# Patient Record
Sex: Female | Born: 1990 | Race: Black or African American | Hispanic: No | Marital: Married | Smoking: Never smoker
Health system: Southern US, Community
[De-identification: ages and names within clinical notes are randomized; demographics above are authoritative.]

## PROBLEM LIST (undated history)

## (undated) ENCOUNTER — Emergency Department (HOSPITAL_COMMUNITY): Admission: EM | Payer: Medicaid Other

## (undated) DIAGNOSIS — J45909 Unspecified asthma, uncomplicated: Secondary | ICD-10-CM

## (undated) DIAGNOSIS — Z789 Other specified health status: Secondary | ICD-10-CM

## (undated) HISTORY — PX: NO PAST SURGERIES: SHX2092

## (undated) HISTORY — DX: Unspecified asthma, uncomplicated: J45.909

---
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
## 2018-10-02 ENCOUNTER — Ambulatory Visit: Payer: Self-pay

## 2018-10-08 ENCOUNTER — Other Ambulatory Visit: Payer: Self-pay | Admitting: Student

## 2018-10-08 ENCOUNTER — Emergency Department (HOSPITAL_COMMUNITY): Payer: Medicaid Other

## 2018-10-08 ENCOUNTER — Encounter (HOSPITAL_COMMUNITY): Payer: Self-pay

## 2018-10-08 ENCOUNTER — Other Ambulatory Visit: Payer: Self-pay

## 2018-10-08 ENCOUNTER — Emergency Department (HOSPITAL_COMMUNITY)
Admission: EM | Admit: 2018-10-08 | Discharge: 2018-10-08 | Disposition: A | Discharge status: Home / Self Care | Payer: Medicaid Other | Attending: Emergency Medicine | Admitting: Emergency Medicine

## 2018-10-08 DIAGNOSIS — O468X1 Other antepartum hemorrhage, first trimester: Secondary | ICD-10-CM | POA: Diagnosis not present

## 2018-10-08 DIAGNOSIS — R102 Pelvic and perineal pain: Secondary | ICD-10-CM | POA: Diagnosis not present

## 2018-10-08 DIAGNOSIS — O418X10 Other specified disorders of amniotic fluid and membranes, first trimester, not applicable or unspecified: Secondary | ICD-10-CM

## 2018-10-08 DIAGNOSIS — O26891 Other specified pregnancy related conditions, first trimester: Secondary | ICD-10-CM

## 2018-10-08 DIAGNOSIS — O9989 Other specified diseases and conditions complicating pregnancy, childbirth and the puerperium: Secondary | ICD-10-CM | POA: Diagnosis present

## 2018-10-08 DIAGNOSIS — Z3A01 Less than 8 weeks gestation of pregnancy: Secondary | ICD-10-CM | POA: Diagnosis not present

## 2018-10-08 LAB — I-STAT BETA HCG BLOOD, ED (MC, WL, AP ONLY): I-stat hCG, quantitative: >2000 m[IU]/mL — ABNORMAL HIGH (ref <5)

## 2018-10-08 LAB — URINALYSIS, ROUTINE W REFLEX MICROSCOPIC
Bacteria, UA: NONE SEEN
Bilirubin Urine: NEGATIVE
Glucose, UA: NEGATIVE mg/dL
Hgb urine dipstick: NEGATIVE
Ketones, ur: NEGATIVE mg/dL
Nitrite: NEGATIVE
Protein, ur: NEGATIVE mg/dL
Specific Gravity, Urine: 1.026 (ref 1.005–1.030)
pH: 5 (ref 5.0–8.0)

## 2018-10-08 LAB — COMPREHENSIVE METABOLIC PANEL
ALT: 18 U/L (ref 0–44)
AST: 15 U/L (ref 15–41)
Albumin: 3.4 g/dL — ABNORMAL LOW (ref 3.5–5.0)
Alkaline Phosphatase: 57 U/L (ref 38–126)
Anion gap: 6 (ref 5–15)
BUN: 8 mg/dL (ref 6–20)
CO2: 22 mmol/L (ref 22–32)
Calcium: 8.9 mg/dL (ref 8.9–10.3)
Chloride: 107 mmol/L (ref 98–111)
Creatinine, Ser: 0.72 mg/dL (ref 0.44–1.00)
GFR calc Af Amer: >60 mL/min (ref >60)
GFR calc non Af Amer: >60 mL/min (ref >60)
Glucose, Bld: 88 mg/dL (ref 70–99)
Potassium: 4.1 mmol/L (ref 3.5–5.1)
Sodium: 135 mmol/L (ref 135–145)
Total Bilirubin: 0.6 mg/dL (ref 0.3–1.2)
Total Protein: 7.2 g/dL (ref 6.5–8.1)

## 2018-10-08 LAB — CBC
HCT: 41.2 % (ref 36.0–46.0)
Hemoglobin: 12.5 g/dL (ref 12.0–15.0)
MCH: 25.6 pg — ABNORMAL LOW (ref 26.0–34.0)
MCHC: 30.3 g/dL (ref 30.0–36.0)
MCV: 84.4 fL (ref 80.0–100.0)
Platelets: 268 10*3/uL (ref 150–400)
RBC: 4.88 MIL/uL (ref 3.87–5.11)
RDW: 14.3 % (ref 11.5–15.5)
WBC: 7.9 10*3/uL (ref 4.0–10.5)
nRBC: 0 % (ref 0.0–0.2)

## 2018-10-08 LAB — LIPASE, BLOOD: Lipase: 17 U/L (ref 11–51)

## 2018-10-08 LAB — WET PREP, GENITAL
Clue Cells Wet Prep HPF POC: NONE SEEN
Sperm: NONE SEEN
Trich, Wet Prep: NONE SEEN
Yeast Wet Prep HPF POC: NONE SEEN

## 2018-10-08 LAB — HCG, QUANTITATIVE, PREGNANCY: hCG, Beta Chain, Quant, S: 20494 m[IU]/mL — ABNORMAL HIGH (ref <5)

## 2018-10-08 MED ORDER — SODIUM CHLORIDE 0.9% FLUSH: 3.0000 mL | Freq: Once | INTRAVENOUS | Status: DC

## 2018-10-08 MED ORDER — ACETAMINOPHEN 325 MG PO TABS
650.0000 mg | ORAL_TABLET | Freq: Once | ORAL | Status: AC
  Administered 2018-10-08: 650 mg via ORAL
  Filled 2018-10-08: qty 2

## 2018-10-08 NOTE — ED Notes (Signed)
US at bedside

## 2018-10-08 NOTE — Discharge Instructions (Addendum)
You were seen in the emergency department today for pelvic/back pain.  Your blood work is overall reassuring.  Your ultrasound shows that you have findings consistent with pregnancy, however it may be too early to entirely tell, you will need to have a repeat ultrasound as well as a repeat blood draw for further assessment of the pregnancy.  Please call OB/GYN to set this up, we have given you our women's outpatient information in your discharge instructions.  Your ultrasound also showed a subchorionic hemorrhage, this can happen in pregnancy and may be contributing to your pain.  Please try taking Tylenol as needed for discomfort. Please start taking a prenatal vitamin.  Please follow-up with OB/GYN, return to the ER for new or worsening symptoms including but limited to worsening pain, vaginal bleeding, dizziness, lightheadedness, passing out, or any other concerns.  Results for orders placed or performed during the hospital encounter of 10/08/18  Wet prep, genital   Specimen: Vaginal  Result Value Ref Range   Yeast Wet Prep HPF POC NONE SEEN NONE SEEN   Trich, Wet Prep NONE SEEN NONE SEEN   Clue Cells Wet Prep HPF POC NONE SEEN NONE SEEN   WBC, Wet Prep HPF POC FEW (A) NONE SEEN   Sperm NONE SEEN   Lipase, blood  Result Value Ref Range   Lipase 17 11 - 51 U/L  Comprehensive metabolic panel  Result Value Ref Range   Sodium 135 135 - 145 mmol/L   Potassium 4.1 3.5 - 5.1 mmol/L   Chloride 107 98 - 111 mmol/L   CO2 22 22 - 32 mmol/L   Glucose, Bld 88 70 - 99 mg/dL   BUN 8 6 - 20 mg/dL   Creatinine, Ser 1.610.72 0.44 - 1.00 mg/dL   Calcium 8.9 8.9 - 09.610.3 mg/dL   Total Protein 7.2 6.5 - 8.1 g/dL   Albumin 3.4 (L) 3.5 - 5.0 g/dL   AST 15 15 - 41 U/L   ALT 18 0 - 44 U/L   Alkaline Phosphatase 57 38 - 126 U/L   Total Bilirubin 0.6 0.3 - 1.2 mg/dL   GFR calc non Af Amer >60 >60 mL/min   GFR calc Af Amer >60 >60 mL/min   Anion gap 6 5 - 15  CBC  Result Value Ref Range   WBC 7.9 4.0 - 10.5  K/uL   RBC 4.88 3.87 - 5.11 MIL/uL   Hemoglobin 12.5 12.0 - 15.0 g/dL   HCT 04.541.2 40.936.0 - 81.146.0 %   MCV 84.4 80.0 - 100.0 fL   MCH 25.6 (L) 26.0 - 34.0 pg   MCHC 30.3 30.0 - 36.0 g/dL   RDW 91.414.3 78.211.5 - 95.615.5 %   Platelets 268 150 - 400 K/uL   nRBC 0.0 0.0 - 0.2 %  Urinalysis, Routine w reflex microscopic  Result Value Ref Range   Color, Urine YELLOW YELLOW   APPearance HAZY (A) CLEAR   Specific Gravity, Urine 1.026 1.005 - 1.030   pH 5.0 5.0 - 8.0   Glucose, UA NEGATIVE NEGATIVE mg/dL   Hgb urine dipstick NEGATIVE NEGATIVE   Bilirubin Urine NEGATIVE NEGATIVE   Ketones, ur NEGATIVE NEGATIVE mg/dL   Protein, ur NEGATIVE NEGATIVE mg/dL   Nitrite NEGATIVE NEGATIVE   Leukocytes,Ua TRACE (A) NEGATIVE   RBC / HPF 0-5 0 - 5 RBC/hpf   WBC, UA 0-5 0 - 5 WBC/hpf   Bacteria, UA NONE SEEN NONE SEEN   Squamous Epithelial / LPF 0-5 0 - 5  Mucus PRESENT   hCG, quantitative, pregnancy  Result Value Ref Range   hCG, Beta Chain, Quant, S 20,494 (H) <5 mIU/mL  I-Stat beta hCG blood, ED  Result Value Ref Range   I-stat hCG, quantitative >2,000.0 (H) <5 mIU/mL   Comment 3           US Ob Comp < 14 Wks  Result Date: 10/08/2018 CLINICAL DATA:  Pelvic pain EXAM: OBSTETRIC <14 WK [place] AND TRANSVAGINAL OB US TECHNIQUE: Both transabdominal and transvaginal ultrasound examinations were performed for complete evaluation of the gestation as well as the maternal uterus, adnexal regions, and pelvic cul-de-sac. Transvaginal technique was performed to assess early pregnancy. COMPARISON:  None. FINDINGS: Intrauterine gestational sac: Visualized Yolk sac:  Not visualized Embryo:  Not visualized Cardiac Activity: Not visualized MSD: 15 mm   6 w   2 d Subchorionic hemorrhage: There is a focus of subchorionic hemorrhage measuring 1.0 x 0.5 cm. Uterus/adnexae: No intrauterine mass. Cervical os closed. No extrauterine pelvic or adnexal mass. No free pelvic fluid. IMPRESSION: Probable early intrauterine gestational sac, but  no yolk sac, fetal pole, or cardiac activity yet visualized. Recommend follow-up quantitative B-HCG levels and follow-up US in 14 days to assess viability. This recommendation follows SRU consensus guidelines: Diagnostic Criteria for Nonviable Pregnancy Early in the First Trimester. [place] Med 2013; 517:6160-73. Based on gestational sac size, estimated gestational age is 6+ weeks. There is a subchorionic hemorrhage measuring 1.0 x 0.5 cm. No extrauterine pelvic mass or fluid. Electronically Signed   By: Lowella Grip III M.D.   On: 10/08/2018 13:05   US Ob Transvaginal  Result Date: 10/08/2018 CLINICAL DATA:  Pelvic pain EXAM: OBSTETRIC <14 WK [place] AND TRANSVAGINAL OB US TECHNIQUE: Both transabdominal and transvaginal ultrasound examinations were performed for complete evaluation of the gestation as well as the maternal uterus, adnexal regions, and pelvic cul-de-sac. Transvaginal technique was performed to assess early pregnancy. COMPARISON:  None. FINDINGS: Intrauterine gestational sac: Visualized Yolk sac:  Not visualized Embryo:  Not visualized Cardiac Activity: Not visualized MSD: 15 mm   6 w   2 d Subchorionic hemorrhage: There is a focus of subchorionic hemorrhage measuring 1.0 x 0.5 cm. Uterus/adnexae: No intrauterine mass. Cervical os closed. No extrauterine pelvic or adnexal mass. No free pelvic fluid. IMPRESSION: Probable early intrauterine gestational sac, but no yolk sac, fetal pole, or cardiac activity yet visualized. Recommend follow-up quantitative B-HCG levels and follow-up US in 14 days to assess viability. This recommendation follows SRU consensus guidelines: Diagnostic Criteria for Nonviable Pregnancy Early in the First Trimester. [employer] Med 2013; 710:6269-48. Based on gestational sac size, estimated gestational age is 6+ weeks. There is a subchorionic hemorrhage measuring 1.0 x 0.5 cm. No extrauterine pelvic mass or fluid. Electronically Signed   By: Lowella Grip III M.D.   On:  10/08/2018 13:05

## 2018-10-08 NOTE — ED Triage Notes (Signed)
Pt states abd and back pain x 1 week. Pt states that she recently had a positive test. Pt states that she normally has irregular periods, so is unsure of how far along she is

## 2018-10-08 NOTE — ED Provider Notes (Signed)
[facility] DEPT Provider Note   CSN: [id number] Arrival date & time: 10/08/18  0913     History   Chief Complaint Chief Complaint  Patient presents with   Possible Pregnancy   Abdominal Pain    HPI Brianna Stein is a 28 y.o. female that is G8P6A1 with what she suspect is a current new pregnancy who presents to the ED w/ complaints of abdominal pain intermittently x 1 week. Patient states pain is to the bilateral lower abdomen/back, crampy in nature, occurs a few times per day and lasts 1 hour with each episode. No alleviating/aggravating factors. Associated nausea. Had + preg test at home, unknown LMP, irregular periods s/p depo which she stopped 2 years ago. No prior pregnancy complications other than 1 miscarriage, all prior vaginal deliveries. Denies fever, chills, emesis, diarrhea, melena, hematochezia, vaginal bleeding/discharge, or dysuria. Sexually active w/ 1 female partner- husband, no concern for STDs.      HPI  History reviewed. No pertinent past medical history.  There are no active problems to display for this patient.   History reviewed. No pertinent surgical history.   OB History    Gravida  1   Para      Term      Preterm      AB      Living        SAB      TAB      Ectopic      Multiple      Live Births               Home Medications    Prior to Admission medications   Not on File    Family History No family history on file.  Social History Social History   Tobacco Use   Smoking status: Never Smoker   Smokeless tobacco: Never Used  Substance Use Topics   Alcohol use: Never    Frequency: Never   Drug use: Never     Allergies   Patient has no known allergies.   Review of Systems Review of Systems  Constitutional: Negative for chills and fever.  Respiratory: Negative for shortness of breath.   Cardiovascular: Negative for chest pain.  Gastrointestinal: Positive for abdominal  pain and nausea. Negative for anal bleeding, blood in stool, constipation, diarrhea and vomiting.  Genitourinary: Negative for dysuria, vaginal bleeding and vaginal discharge.  Musculoskeletal: Positive for back pain.  All other systems reviewed and are negative.    Physical Exam Updated Vital Signs BP (!) 116/53 (BP Location: Right Arm)    Pulse 77    Temp 98.3 F (36.8 C) (Oral)    Resp 16    Ht 5\' 1"  (1.549 m)    Wt 117.9 kg    SpO2 100%    BMI 49.13 kg/m   Physical Exam Vitals signs and nursing note reviewed.  Constitutional:      General: She is not in acute distress.    Appearance: She is well-developed. She is not toxic-appearing.  HENT:     Head: Normocephalic and atraumatic.  Eyes:     General:        Right eye: No discharge.        Left eye: No discharge.     Conjunctiva/sclera: Conjunctivae normal.  Neck:     Musculoskeletal: Neck supple.  Cardiovascular:     Rate and Rhythm: Normal rate and regular rhythm.  Pulmonary:     Effort: Pulmonary effort is normal.  No respiratory distress.     Breath sounds: Normal breath sounds. No wheezing, rhonchi or rales.  Abdominal:     General: There is no distension.     Palpations: Abdomen is soft.     Tenderness: There is abdominal tenderness in the suprapubic area. There is no guarding or rebound. Negative signs include Murphy's sign and McBurney's sign.  Genitourinary:    Labia:        Right: No rash, tenderness or lesion.        Left: No rash, tenderness or lesion.      Adnexa:        Right: No mass or fullness.         Left: No mass or fullness.       Comments: EDT present as chaperone. Diffuse discomfort throughout bimanual.  Minimal vaginal discharge present- thin & white No bleeding.    Skin:    General: Skin is warm and dry.     Findings: No rash.  Neurological:     Mental Status: She is alert.     Comments: Clear speech.   Psychiatric:        Behavior: Behavior normal.      ED Treatments / Results    Labs (all labs ordered are listed, but only abnormal results are displayed) Labs Reviewed  WET PREP, GENITAL - Abnormal; Notable for the following components:      Result Value   WBC, Wet Prep HPF POC FEW (*)    All other components within normal limits  COMPREHENSIVE METABOLIC PANEL - Abnormal; Notable for the following components:   Albumin 3.4 (*)    All other components within normal limits  CBC - Abnormal; Notable for the following components:   MCH 25.6 (*)    All other components within normal limits  URINALYSIS, ROUTINE W REFLEX MICROSCOPIC - Abnormal; Notable for the following components:   APPearance HAZY (*)    Leukocytes,Ua TRACE (*)    All other components within normal limits  HCG, QUANTITATIVE, PREGNANCY - Abnormal; Notable for the following components:   hCG, Beta Chain, Quant, S 20,494 (*)    All other components within normal limits  I-STAT BETA HCG BLOOD, ED ([facility], [facility], [facility] ONLY) - Abnormal; Notable for the following components:   I-stat hCG, quantitative >2,000.0 (*)    All other components within normal limits  URINE CULTURE  LIPASE, BLOOD  RPR  HIV ANTIBODY (ROUTINE TESTING W REFLEX)  GC/CHLAMYDIA PROBE AMP ([place]) NOT AT [facility]    EKG None  Radiology US Ob Comp < 14 Wks  Result Date: 10/08/2018 CLINICAL DATA:  Pelvic pain EXAM: OBSTETRIC <14 WK [place] AND TRANSVAGINAL OB US TECHNIQUE: Both transabdominal and transvaginal ultrasound examinations were performed for complete evaluation of the gestation as well as the maternal uterus, adnexal regions, and pelvic cul-de-sac. Transvaginal technique was performed to assess early pregnancy. COMPARISON:  None. FINDINGS: Intrauterine gestational sac: Visualized Yolk sac:  Not visualized Embryo:  Not visualized Cardiac Activity: Not visualized MSD: 15 mm   6 w   2 d Subchorionic hemorrhage: There is a focus of subchorionic hemorrhage measuring 1.0 x 0.5 cm. Uterus/adnexae: No intrauterine mass. Cervical os closed. No  extrauterine pelvic or adnexal mass. No free pelvic fluid. IMPRESSION: Probable early intrauterine gestational sac, but no yolk sac, fetal pole, or cardiac activity yet visualized. Recommend follow-up quantitative B-HCG levels and follow-up US in 14 days to assess viability. This recommendation follows SRU consensus guidelines: Diagnostic Criteria for  Nonviable Pregnancy Early in the First Trimester. Malva Limes Engl J Med 2013; 161:0960-45; 369:1443-51. Based on gestational sac size, estimated gestational age is 6+ weeks. There is a subchorionic hemorrhage measuring 1.0 x 0.5 cm. No extrauterine pelvic mass or fluid. Electronically Signed   By: Bretta BangWilliam  Woodruff III M.D.   On: 10/08/2018 13:05   [place] Ob Transvaginal  Result Date: 10/08/2018 CLINICAL DATA:  Pelvic pain EXAM: OBSTETRIC <14 WK US AND TRANSVAGINAL OB US TECHNIQUE: Both transabdominal and transvaginal ultrasound examinations were performed for complete evaluation of the gestation as well as the maternal uterus, adnexal regions, and pelvic cul-de-sac. Transvaginal technique was performed to assess early pregnancy. COMPARISON:  None. FINDINGS: Intrauterine gestational sac: Visualized Yolk sac:  Not visualized Embryo:  Not visualized Cardiac Activity: Not visualized MSD: 15 mm   6 w   2 d Subchorionic hemorrhage: There is a focus of subchorionic hemorrhage measuring 1.0 x 0.5 cm. Uterus/adnexae: No intrauterine mass. Cervical os closed. No extrauterine pelvic or adnexal mass. No free pelvic fluid. IMPRESSION: Probable early intrauterine gestational sac, but no yolk sac, fetal pole, or cardiac activity yet visualized. Recommend follow-up quantitative B-HCG levels and follow-up US in 14 days to assess viability. This recommendation follows SRU consensus guidelines: Diagnostic Criteria for Nonviable Pregnancy Early in the First Trimester. Malva Limes Engl J Med 2013; 409:8119-14; 369:1443-51. Based on gestational sac size, estimated gestational age is 6+ weeks. There is a subchorionic hemorrhage  measuring 1.0 x 0.5 cm. No extrauterine pelvic mass or fluid. Electronically Signed   By: Bretta BangWilliam  Woodruff III M.D.   On: 10/08/2018 13:05    Procedures Procedures (including critical care time)  Medications Ordered in ED Medications  sodium chloride flush (NS) 0.9 % injection 3 mL (has no administration in time range)  acetaminophen (TYLENOL) tablet 650 mg (650 mg Oral Given 10/08/18 1350)     Initial Impression / Assessment and Plan / ED Course  I have reviewed the triage vital signs and the nursing notes.  Pertinent labs & imaging results that were available during my care of the patient were reviewed by me and considered in my medical decision making (see chart for details).   Patient who is G8, P6 A1 with current pregnancy presents to the emergency department for evaluation of intermittent abdominal/back pain.  She is nontoxic-appearing, resting comfortably, vitals without significant abnormality.  She has not had ultrasound to confirm pregnancy or seen an OB/GYN yet.  Exam with mild suprapubic tenderness as well as diffuse discomfort throughout bimanual exam.  No active vaginal bleeding.  Pregnancy test is positive.  We will proceed with labs as well as ultrasound, rule out ectopic.  CBC: No leukocytosis or anemia CMP: Albumin minimally low, electrolytes within normal limits, renal function and liver function preserved. Lipase: WNL Urinalysis: Trace leukocytes, no bacteria, will culture given she is pregnant. Quant: 78,295: 20,494  US: Probable early intrauterine gestational sac, but no yolk sac, fetal pole, or cardiac activity yet visualized. Recommend follow-up quantitative B-HCG levels and follow-up US in 14 days to assess viability.  Based on gestational sac size, estimated gestational age is 6+ weeks. There is a subchorionic hemorrhage measuring 1.0 x 0.5 cm. No extrauterine pelvic mass or fluid.   Will discharge home with instructions to start prenatal vitamins, tylenol for pain, &  close OB follow up for repeat quant/US- she has appointment scheduled for this upcoming monday. I discussed results, treatment plan, need for follow-up, and return precautions with the patient. Provided opportunity for questions, patient confirmed understanding and  is in agreement with plan.   Findings and plan of care discussed with supervising physician Dr. Jacqulyn BathLong who is in agreement.    Final Clinical Impressions(s) / ED Diagnoses   Final diagnoses:  Pelvic pain affecting pregnancy in first trimester, antepartum  Subchorionic hemorrhage of placenta in first trimester, single or unspecified fetus    ED Discharge Orders    None       Cherly Andersonetrucelli, Aleenah Homen R, PA-C 10/08/18 1351    Long, Arlyss RepressJoshua G, MD 10/09/18 [phone]

## 2018-10-08 NOTE — ED Notes (Signed)
ED Provider at bedside. 

## 2018-10-09 LAB — CERVICOVAGINAL ANCILLARY ONLY
Chlamydia: NEGATIVE
Neisseria Gonorrhea: NEGATIVE

## 2018-10-09 LAB — HIV ANTIBODY (ROUTINE TESTING W REFLEX): HIV Screen 4th Generation wRfx: NONREACTIVE

## 2018-10-09 LAB — RPR: RPR Ser Ql: NONREACTIVE

## 2018-10-09 LAB — URINE CULTURE

## 2018-10-12 ENCOUNTER — Ambulatory Visit (INDEPENDENT_AMBULATORY_CARE_PROVIDER_SITE_OTHER): Payer: Medicaid Other | Admitting: *Deleted

## 2018-10-12 ENCOUNTER — Other Ambulatory Visit: Payer: Self-pay

## 2018-10-12 VITALS — BP 138/82 | HR 98 | Temp 97.9°F | Ht 61.0 in | Wt 309.0 lb

## 2018-10-12 DIAGNOSIS — Z3480 Encounter for supervision of other normal pregnancy, unspecified trimester: Secondary | ICD-10-CM | POA: Insufficient documentation

## 2018-10-12 DIAGNOSIS — Z3201 Encounter for pregnancy test, result positive: Secondary | ICD-10-CM | POA: Diagnosis not present

## 2018-10-12 DIAGNOSIS — Z3200 Encounter for pregnancy test, result unknown: Secondary | ICD-10-CM

## 2018-10-12 LAB — POCT URINE PREGNANCY: Preg Test, Ur: POSITIVE — AB

## 2018-10-12 MED ORDER — VITAFOL GUMMIES 3.33-0.333-34.8 MG PO CHEW: 3.0000 | CHEWABLE_TABLET | Freq: Every day | ORAL | 12 refills | Status: DC

## 2018-10-12 NOTE — Progress Notes (Signed)
    PRENATAL INTAKE SUMMARY  Ms. Maclellan presents today New OB Nurse Interview.  OB History    Gravida  7   Para  5   Term  4   Preterm  1   AB  1   Living  5     SAB  1   TAB      Ectopic      Multiple      Live Births  5          I have reviewed the patient's medical, obstetrical, social, and family histories, medications, and available lab results.  Ms. Zehring presents today for UPT. She has no unusual complaints. LMP: 08/05/2018 (questionable).  SUBJECTIVE She has no unusual complaints.  OBJECTIVE Initial nurse interview for history (New OB). Home UPT Result: Positive In-Office UPT result: Positive I have reviewed the patient's medical, obstetrical, social, and family histories, and medications.   GENERAL APPEARANCE: alert, well appearing, in no apparent distress, oriented to person, place and time, overweight  EDD: 06/01/2019 by ultrasound on 10/08/2018 GA: [redacted]w[redacted]d G7P4115  ASSESSMENT Positive pregnancy test Normal pregnancy  PLAN Prenatal vitamins (gummies) sent to pharmacy. All labs/physical will be completed at next visit with provider.  Derl Barrow, RN

## 2018-11-06 ENCOUNTER — Other Ambulatory Visit (HOSPITAL_COMMUNITY)
Admission: RE | Admit: 2018-11-06 | Discharge: 2018-11-06 | Disposition: A | Discharge status: Home / Self Care | Payer: Medicaid Other | Source: Ambulatory Visit | Attending: Advanced Practice Midwife | Admitting: Advanced Practice Midwife

## 2018-11-06 ENCOUNTER — Ambulatory Visit (INDEPENDENT_AMBULATORY_CARE_PROVIDER_SITE_OTHER): Payer: Medicaid Other | Admitting: Advanced Practice Midwife

## 2018-11-06 ENCOUNTER — Other Ambulatory Visit: Payer: Self-pay

## 2018-11-06 ENCOUNTER — Encounter: Payer: Self-pay | Admitting: Advanced Practice Midwife

## 2018-11-06 ENCOUNTER — Encounter: Payer: Self-pay | Admitting: General Practice

## 2018-11-06 VITALS — BP 115/68 | HR 87 | Temp 98.3°F | Wt 304.4 lb

## 2018-11-06 DIAGNOSIS — Z3480 Encounter for supervision of other normal pregnancy, unspecified trimester: Secondary | ICD-10-CM | POA: Diagnosis present

## 2018-11-06 DIAGNOSIS — Z124 Encounter for screening for malignant neoplasm of cervix: Secondary | ICD-10-CM | POA: Diagnosis present

## 2018-11-06 DIAGNOSIS — Z3481 Encounter for supervision of other normal pregnancy, first trimester: Secondary | ICD-10-CM | POA: Diagnosis not present

## 2018-11-06 DIAGNOSIS — Z3A10 10 weeks gestation of pregnancy: Secondary | ICD-10-CM

## 2018-11-06 MED ORDER — CYCLOBENZAPRINE HCL 10 MG PO TABS: 10.0000 mg | ORAL_TABLET | Freq: Three times a day (TID) | ORAL | 1 refills | Status: DC | PRN

## 2018-11-06 MED ORDER — VITAFOL GUMMIES 3.33-0.333-34.8 MG PO CHEW: 3.0000 | CHEWABLE_TABLET | Freq: Every day | ORAL | 12 refills | Status: DC

## 2018-11-06 MED ORDER — BLOOD PRESSURE MONITOR AUTOMAT DEVI: 1.0000 | Freq: Every day | 0 refills | Status: DC

## 2018-11-06 MED ORDER — ONDANSETRON 8 MG PO TBDP: 8.0000 mg | ORAL_TABLET | Freq: Three times a day (TID) | ORAL | 0 refills | Status: DC | PRN

## 2018-11-06 NOTE — Addendum Note (Signed)
Addended by: Derl Barrow on: 11/06/2018 09:36 AM   Modules accepted: Orders

## 2018-11-06 NOTE — Progress Notes (Signed)
Subjective:   Brianna Stein is a 28 y.o. [id number] at [redacted]w[redacted]d by LMP, early ultrasound being seen today for her first obstetrical visit.  Her obstetrical history is significant for obesity. Patient does intend to breast feed. Pregnancy history fully reviewed.  Patient reports backache and nausea.  HISTORY: OB History  Gravida Para Term Preterm AB Living  7 5 4 1 1 5   SAB TAB Ectopic Multiple Live Births  1 0 0 0 5    # Outcome Date GA Lbr Len/2nd Weight Sex Delivery Anes PTL Lv  7 Current           6 Term 12/01/16 [redacted]w[redacted]d  7 lb (3.175 kg) M Vag-Spont EPI N LIV  5 Preterm 12/18/15 [redacted]w[redacted]d  5 lb 6 oz (2.438 kg) F Vag-Spont EPI N LIV  4 Term 12/10/14 [redacted]w[redacted]d  7 lb (3.175 kg) M Vag-Spont  N LIV  3 Term 06/24/13 [redacted]w[redacted]d  6 lb 7 oz (2.92 kg) F Vag-Spont EPI N LIV  2 SAB 2015          1 Term 01/02/09 [redacted]w[redacted]d  6 lb 8 oz (2.948 kg) F Vag-Spont  Y LIV    Last pap smear was done unsure and was unsure  Past Medical History:  Diagnosis Date  . Medical history non-contributory    Past Surgical History:  Procedure Laterality Date  . NO PAST SURGERIES     No family history on file. Social History   Tobacco Use  . Smoking status: Never Smoker  . Smokeless tobacco: Never Used  Substance Use Topics  . Alcohol use: Never    Frequency: Never  . Drug use: Never   No Known Allergies Current Outpatient Medications on File Prior to Visit  Medication Sig Dispense Refill  . acetaminophen (TYLENOL) 325 MG tablet Take 650 mg by mouth every 6 (six) hours as needed for mild pain or headache.    . Prenatal Vit-Fe Phos-FA-Omega (VITAFOL GUMMIES) 3.33-0.333-34.8 MG CHEW Chew 3 each by mouth daily. 90 tablet 12   No current facility-administered medications on file prior to visit.     Review of Systems Pertinent items noted in HPI and remainder of comprehensive ROS otherwise negative.  Exam   Vitals:   11/06/18 0851  BP: 115/68  Pulse: 87  Temp: 98.3 F (36.8 C)  Weight: (!) 304 lb 6.4 oz (138.1 kg)       Physical Exam   Assessment:   Pregnancy: [id number] Patient Active Problem List   Diagnosis Date Noted  . Supervision of other normal pregnancy, antepartum 10/12/2018     Plan:  1. Supervision of other normal pregnancy, antepartum - Routine care - Obstetric Panel, Including HIV - Culture, OB Urine - Genetic Screening - Hemoglobin A1c - Cytology - PAP( [place]) - Cervicovaginal ancillary only( [place])  2. Pap smear for cervical cancer screening - Cytology - PAP( [place])   Initial labs drawn. Continue prenatal vitamins. Genetic Screening discussed, NIPS: requested. Ultrasound discussed; fetal anatomic survey: requested. Problem list reviewed and updated. The nature of [facility] with multiple MDs and other Advanced Practice Providers was explained to patient; also emphasized that residents, students are part of our team. Routine obstetric precautions reviewed. 50% of 45 min visit spent in counseling and coordination of care. FU in 4 weeks for virtual visit, will send in RX for BP cuff  No follow-ups on file.   Marcille Buffy DNP, CNM  11/06/18  8:53 AM

## 2018-11-07 LAB — OBSTETRIC PANEL, INCLUDING HIV
Antibody Screen: NEGATIVE
Basophils Absolute: 0 10*3/uL (ref 0.0–0.2)
Basos: 0 %
EOS (ABSOLUTE): 0.2 10*3/uL (ref 0.0–0.4)
Eos: 2 %
HIV Screen 4th Generation wRfx: NONREACTIVE
Hematocrit: 38 % (ref 34.0–46.6)
Hemoglobin: 12.2 g/dL (ref 11.1–15.9)
Hepatitis B Surface Ag: NEGATIVE
Immature Grans (Abs): 0 10*3/uL (ref 0.0–0.1)
Immature Granulocytes: 0 %
Lymphocytes Absolute: 2 10*3/uL (ref 0.7–3.1)
Lymphs: 23 %
MCH: 25.7 pg — ABNORMAL LOW (ref 26.6–33.0)
MCHC: 32.1 g/dL (ref 31.5–35.7)
MCV: 80 fL (ref 79–97)
Monocytes Absolute: 0.6 10*3/uL (ref 0.1–0.9)
Monocytes: 7 %
Neutrophils Absolute: 5.7 10*3/uL (ref 1.4–7.0)
Neutrophils: 68 %
Platelets: 274 10*3/uL (ref 150–450)
RBC: 4.74 x10E6/uL (ref 3.77–5.28)
RDW: 14.5 % (ref 11.7–15.4)
RPR Ser Ql: NONREACTIVE
Rh Factor: POSITIVE
Rubella Antibodies, IGG: 3.93 index (ref >0.99)
WBC: 8.4 10*3/uL (ref 3.4–10.8)

## 2018-11-07 LAB — HEMOGLOBIN A1C
Est. average glucose Bld gHb Est-mCnc: 111 mg/dL
Hgb A1c MFr Bld: 5.5 % (ref 4.8–5.6)

## 2018-11-08 LAB — URINE CULTURE, OB REFLEX

## 2018-11-08 LAB — CULTURE, OB URINE

## 2018-11-09 LAB — CYTOLOGY - PAP: Diagnosis: NEGATIVE

## 2018-11-11 LAB — CERVICOVAGINAL ANCILLARY ONLY
Bacterial Vaginitis (gardnerella): POSITIVE — AB
Candida Glabrata: NEGATIVE
Candida Vaginitis: POSITIVE — AB
Chlamydia: NEGATIVE
Comment: NEGATIVE
Comment: NEGATIVE
Comment: NEGATIVE
Comment: NEGATIVE
Comment: NEGATIVE
Comment: NORMAL
Neisseria Gonorrhea: NEGATIVE
Trichomonas: NEGATIVE

## 2018-11-11 MED ORDER — TERCONAZOLE 0.4 % VA CREA: 1.0000 | TOPICAL_CREAM | Freq: Every day | VAGINAL | 0 refills | Status: AC

## 2018-11-11 MED ORDER — METRONIDAZOLE 500 MG PO TABS: 500.0000 mg | ORAL_TABLET | Freq: Two times a day (BID) | ORAL | 0 refills | Status: DC

## 2018-11-11 NOTE — Addendum Note (Signed)
Addended by: Marcille Buffy D on: 11/11/2018 01:03 PM   Modules accepted: Orders

## 2018-11-17 ENCOUNTER — Encounter: Payer: Self-pay | Admitting: General Practice

## 2018-12-03 ENCOUNTER — Other Ambulatory Visit: Payer: Self-pay

## 2018-12-03 ENCOUNTER — Encounter: Payer: Self-pay | Admitting: General Practice

## 2018-12-03 ENCOUNTER — Ambulatory Visit (INDEPENDENT_AMBULATORY_CARE_PROVIDER_SITE_OTHER): Payer: Medicaid Other | Admitting: Obstetrics and Gynecology

## 2018-12-03 ENCOUNTER — Encounter: Payer: Self-pay | Admitting: Obstetrics and Gynecology

## 2018-12-03 VITALS — BP 116/80 | HR 116 | Temp 98.3°F | Wt 304.0 lb

## 2018-12-03 DIAGNOSIS — O99211 Obesity complicating pregnancy, first trimester: Secondary | ICD-10-CM

## 2018-12-03 DIAGNOSIS — Z3480 Encounter for supervision of other normal pregnancy, unspecified trimester: Secondary | ICD-10-CM

## 2018-12-03 DIAGNOSIS — Z3A14 14 weeks gestation of pregnancy: Secondary | ICD-10-CM

## 2018-12-03 NOTE — Progress Notes (Signed)
     LOW-RISK PREGNANCY OFFICE VISIT Patient name: Brianna Stein MRN [id number]  Date of birth: 1990/07/25 Chief Complaint:   Routine Prenatal Visit  History of Present Illness:   Brianna Stein is a 28 y.o. [id number] female at 104w2d with an Estimated Date of Delivery: 06/01/19 being seen today for ongoing management of a low-risk pregnancy.  Today she reports no complaints. Contractions: Not present. Vag. Bleeding: None.  Movement: Absent. denies leaking of fluid. Review of Systems:   Pertinent items are noted in HPI Denies abnormal vaginal discharge w/ itching/odor/irritation, headaches, visual changes, shortness of breath, chest pain, abdominal pain, severe nausea/vomiting, or problems with urination or bowel movements unless otherwise stated above. Pertinent History Reviewed:  Reviewed past medical,surgical, social, obstetrical and family history.  Reviewed problem list, medications and allergies. Physical Assessment:   Vitals:   12/03/18 0959  BP: 116/80  Pulse: (!) 116  Temp: 98.3 F (36.8 C)  Weight: (!) 304 lb (137.9 kg)  Body mass index is 57.44 kg/m.        Physical Examination:   General appearance: Well appearing, and in no distress  Mental status: Alert, oriented to person, place, and time  Skin: Warm & dry  Cardiovascular: Normal heart rate noted  Respiratory: Normal respiratory effort, no distress  Abdomen: Soft, gravid, nontender  Pelvic: Cervical exam deferred         Extremities: Edema: None  Patient informed that the ultrasound is considered a limited OB ultrasound and is not intended to be a complete ultrasound exam.  Patient also informed that the ultrasound is not being completed with the intent of assessing for fetal or placental anomalies or any pelvic abnormalities.  Explained that the purpose of today's ultrasound is to assess for viability.  Baby was found to be active, FHR visually in 140s. Patient acknowledges the purpose of the exam and the limitations of  the study.    Fetal Status: Fetal Heart Rate (bpm): 140   Movement: Absent      Assessment & Plan:  1) Low-risk pregnancy [id number] at [redacted]w[redacted]d with an Estimated Date of Delivery: 06/01/19   2) Obesity affecting pregnancy, antepartum, first trimester  - [place] MFM OB COMP + 84 WK  3) Supervision of other normal pregnancy, antepartum  - Genetic Screening,  - [place] MFM OB COMP + 33 WK - Reassurance given that fetal well-being is normal by today's visit, but we had to ensure that she had a viable pregnancy.    Meds: Rx for Phenergan 12.5 mg every 6 hours prn n/v  Labs/procedures today: panorama repeated per recommendation of [facility]  Plan:  Continue routine obstetrical care   Reviewed: Preterm labor symptoms and general obstetric precautions including but not limited to vaginal bleeding, contractions, leaking of fluid and fetal movement were reviewed in detail with the patient.  All questions were answered. Has home bp cuff. Check bp weekly, let us know if >140/90.   Follow-up: Return in about 6 weeks (around 01/14/2019) for Return OB - My Chart video.  Orders Placed This Encounter  Procedures  . [place] MFM OB COMP + 14 WK  . Genetic Screening   Laury Deep MSN, CNM 12/03/2018 1:06 PM

## 2018-12-05 ENCOUNTER — Encounter: Payer: Self-pay | Admitting: Obstetrics and Gynecology

## 2018-12-14 ENCOUNTER — Encounter: Payer: Self-pay | Admitting: General Practice

## 2018-12-31 ENCOUNTER — Other Ambulatory Visit: Payer: Self-pay

## 2018-12-31 ENCOUNTER — Telehealth (INDEPENDENT_AMBULATORY_CARE_PROVIDER_SITE_OTHER): Payer: Medicaid Other | Admitting: Obstetrics and Gynecology

## 2018-12-31 ENCOUNTER — Encounter: Payer: Self-pay | Admitting: Obstetrics and Gynecology

## 2018-12-31 DIAGNOSIS — G8929 Other chronic pain: Secondary | ICD-10-CM

## 2018-12-31 DIAGNOSIS — M5430 Sciatica, unspecified side: Secondary | ICD-10-CM

## 2018-12-31 DIAGNOSIS — R519 Headache, unspecified: Secondary | ICD-10-CM

## 2018-12-31 DIAGNOSIS — Z3480 Encounter for supervision of other normal pregnancy, unspecified trimester: Secondary | ICD-10-CM

## 2018-12-31 DIAGNOSIS — Z3A18 18 weeks gestation of pregnancy: Secondary | ICD-10-CM

## 2018-12-31 DIAGNOSIS — N949 Unspecified condition associated with female genital organs and menstrual cycle: Secondary | ICD-10-CM

## 2018-12-31 DIAGNOSIS — O26892 Other specified pregnancy related conditions, second trimester: Secondary | ICD-10-CM

## 2018-12-31 MED ORDER — BLOOD PRESSURE MONITOR AUTOMAT DEVI: 1.0000 | Freq: Every day | 0 refills | Status: DC

## 2018-12-31 NOTE — Progress Notes (Signed)
MY CHART VIDEO VIRTUAL OBSTETRICS VISIT ENCOUNTER NOTE  I connected with Brianna Stein on 12/31/18 at 10:50 AM EST by My Chart video at [employer] and verified that I am speaking with the correct person using two identifiers.   I discussed the limitations, risks, security and privacy concerns of performing an evaluation and management service by My Chart video and the availability of in person appointments. I also discussed with the patient that there may be a patient responsible charge related to this service. The patient expressed understanding and agreed to proceed.  Subjective:  Brianna Stein is a 28 y.o. [id number] at [redacted]w[redacted]d being followed for ongoing prenatal care.  She is currently monitored for the following issues for this low-risk pregnancy and has Supervision of other normal pregnancy, antepartum on their problem list.  Patient reports backache, headache, nausea, vomiting and round ligament pain. She reports a sharp pain in her lower back that "sometimes" radiates all the way down the back of leg, occasinal abdominal cramping today. She reports a temporal (but all over) headache that is "the worst" on most days. She states the Flexeril is not working. Reports not feeling fetal movement yet and "trying not to be overly concerned". She is anxious to have her anatomy ultrasound next week. Denies any contractions, bleeding or leaking of fluid.   The following portions of the patient's history were reviewed and updated as appropriate: allergies, current medications, past family history, past medical history, past social history, past surgical history and problem list.   Objective:   General:  Alert, oriented and cooperative.   Mental Status: Normal mood and affect perceived. Normal judgment and thought content.  Rest of physical exam deferred due to type of encounter  LMP 08/05/2018 (Within Weeks)  **No VS; patient not at home and has not received BP cuff yet  Assessment and Plan:   Pregnancy: [id number] at [redacted]w[redacted]d  1. Supervision of other normal pregnancy, antepartum - Blood Pressure Monitoring (BLOOD PRESSURE MONITOR AUTOMAT) DEVI; 1 Device by Does not apply route daily. Automatic blood pressure large cuff. To take blood pressure regularly at home. ICD-10 code: O45.90  Dispense: 1 each; Refill: 0  2. Sciatic nerve pain, unspecified laterality - Ambulatory referral to Physical Therapy  3. Round ligament pain - Advised this is normal variation of pregnancy - Information provided on round ligament pain via My Chart   4. Chronic intractable headache, unspecified headache type - Advised to go to MAU for BP check and Headache cocktail by IV - Patient states she "probably won't go because of lack of transportation; have to either walk or take an [place]." Offered for patient to take EMS to MAU, if headache becomes intolerable and take an [place] home.  Preterm labor symptoms and general obstetric precautions including but not limited to vaginal bleeding, contractions, leaking of fluid and fetal movement were reviewed in detail with the patient.  I discussed the assessment and treatment plan with the patient. The patient was provided an opportunity to ask questions and all were answered. The patient agreed with the plan and demonstrated an understanding of the instructions. The patient was advised to call back or seek an in-person office evaluation/go to MAU at [facility] for any urgent or concerning symptoms. Please refer to After Visit Summary for other counseling recommendations.   I provided 10 minutes of non-face-to-face time during this encounter. There was 5 minutes of chart review time spent prior to this encounter. Total time spent = 15 minutes.  Return in about 2 weeks (around 01/14/2019) for Return OB - My Chart video.  Future Appointments  Date Time Provider Department Center  01/05/2019 10:30 AM [facility] [place] 1 [facility]-M[facility] M[facility]US  01/19/2019  2:00 PM Lum Keas [facility]  01/28/2019 10:10 AM Raelyn Mora, CNM [facility] None    Raelyn Mora, CNM Center for [employer], [facility]

## 2018-12-31 NOTE — Patient Instructions (Signed)

## 2019-01-05 ENCOUNTER — Other Ambulatory Visit: Payer: Self-pay

## 2019-01-05 ENCOUNTER — Ambulatory Visit (HOSPITAL_COMMUNITY)
Admission: RE | Admit: 2019-01-05 | Discharge: 2019-01-05 | Disposition: A | Discharge status: Home / Self Care | Payer: Medicaid Other | Source: Ambulatory Visit | Attending: Obstetrics and Gynecology | Admitting: Obstetrics and Gynecology

## 2019-01-05 DIAGNOSIS — E669 Obesity, unspecified: Secondary | ICD-10-CM

## 2019-01-05 DIAGNOSIS — Z3480 Encounter for supervision of other normal pregnancy, unspecified trimester: Secondary | ICD-10-CM | POA: Diagnosis not present

## 2019-01-05 DIAGNOSIS — O99212 Obesity complicating pregnancy, second trimester: Secondary | ICD-10-CM | POA: Diagnosis not present

## 2019-01-05 DIAGNOSIS — O99211 Obesity complicating pregnancy, first trimester: Secondary | ICD-10-CM | POA: Insufficient documentation

## 2019-01-05 DIAGNOSIS — Z363 Encounter for antenatal screening for malformations: Secondary | ICD-10-CM

## 2019-01-05 DIAGNOSIS — Z3A19 19 weeks gestation of pregnancy: Secondary | ICD-10-CM | POA: Diagnosis not present

## 2019-01-06 ENCOUNTER — Other Ambulatory Visit (HOSPITAL_COMMUNITY): Payer: Self-pay | Admitting: *Deleted

## 2019-01-06 DIAGNOSIS — O99210 Obesity complicating pregnancy, unspecified trimester: Secondary | ICD-10-CM

## 2019-01-19 ENCOUNTER — Ambulatory Visit: Payer: Medicaid Other | Admitting: Physical Therapy

## 2019-01-22 NOTE — L&D Delivery Note (Signed)
OB/GYN Faculty Practice Delivery Note  Boyd Litaker is a 29 y.o. [id number] s/p NSVD at [redacted]w[redacted]d. She was admitted for preterm labor.   ROM: 5h 56m with clear fluid GBS Status: unknown, was given PCN Maximum Maternal Temperature: 98.5*F  Labor Progress: Admitted with painful contractions and 4 cm dilated. She was given one dose of BMZ. Made some cervical changed, and was subsequently augmented with AROM and pitocin. She progressed to complete and delivered shortly thereafter.  Delivery Date/Time: 04/27/19, 1710 hours Delivery: Called to room and patient was complete and pushing. Head delivered LOA. Tight nuchal cord x1 which was delivered through. Shoulder and body delivered in usual fashion. Infant with spontaneous cry, placed on mother's abdomen, dried and stimulated. Cord clamped x 2 after 1-minute delay, and cut by FOB under my direct supervision. Cord blood drawn. Placenta delivered spontaneously with gentle cord traction. Fundus firm with massage and Pitocin. Labia, perineum, vagina, and cervix were inspected, and a small periurethral abrasion was noted- hemostatic and not repaired.   Neonatology team present for delivery  Placenta: intact, 3 vessel cord, to pathology 2/2 preterm labor Complications: None Lacerations: periurethral abrasion- hemostatic, not repaired QBL: 326 mL Analgesia: epidural  Postpartum Planning [x]  message to sent to schedule follow-up  [x]  vaccines UTD  Infant: female  APGARs 10, 10  weight per medical record  , DO OB/GYN Fellow, Faculty Practice

## 2019-01-28 ENCOUNTER — Encounter: Payer: Self-pay | Admitting: Obstetrics and Gynecology

## 2019-01-28 ENCOUNTER — Ambulatory Visit: Payer: Medicaid Other

## 2019-01-28 ENCOUNTER — Telehealth (INDEPENDENT_AMBULATORY_CARE_PROVIDER_SITE_OTHER): Payer: Medicaid Other | Admitting: Obstetrics and Gynecology

## 2019-01-28 ENCOUNTER — Other Ambulatory Visit: Payer: Self-pay

## 2019-01-28 ENCOUNTER — Other Ambulatory Visit: Payer: Self-pay | Admitting: *Deleted

## 2019-01-28 VITALS — BP 138/84 | HR 106 | Temp 97.7°F | Wt 298.8 lb

## 2019-01-28 DIAGNOSIS — Z3480 Encounter for supervision of other normal pregnancy, unspecified trimester: Secondary | ICD-10-CM

## 2019-01-28 DIAGNOSIS — M5430 Sciatica, unspecified side: Secondary | ICD-10-CM

## 2019-01-28 DIAGNOSIS — O99891 Other specified diseases and conditions complicating pregnancy: Secondary | ICD-10-CM

## 2019-01-28 DIAGNOSIS — Z3A22 22 weeks gestation of pregnancy: Secondary | ICD-10-CM

## 2019-01-28 MED ORDER — BLOOD PRESSURE MONITOR AUTOMAT DEVI: 1.0000 | Freq: Every day | 0 refills | Status: DC

## 2019-01-28 MED ORDER — VITAFOL GUMMIES 3.33-0.333-34.8 MG PO CHEW: 3.0000 | CHEWABLE_TABLET | Freq: Every day | ORAL | 4 refills | Status: DC

## 2019-01-28 MED ORDER — GOJJI WEIGHT SCALE MISC: 1.0000 | Freq: Every day | 0 refills | Status: DC | PRN

## 2019-01-28 MED ORDER — CYCLOBENZAPRINE HCL 10 MG PO TABS: 10.0000 mg | ORAL_TABLET | Freq: Three times a day (TID) | ORAL | 1 refills | Status: DC | PRN

## 2019-01-28 NOTE — Progress Notes (Signed)
Patient stated she has not received her blood pressure cuff. [employer] and they did not receive Rx for BP cuff from 12/31/2018. Rx resent for BP cuff and weight scale.  Clovis Pu, RN

## 2019-01-28 NOTE — Progress Notes (Signed)
MY CHART VIDEO VIRTUAL OBSTETRICS VISIT ENCOUNTER NOTE  I connected with Brianna Stein on 01/28/19 at 10:10 AM EST by My Chart video at home and verified that I am speaking with the correct person using two identifiers.   I discussed the limitations, risks, security and privacy concerns of performing an evaluation and management service by My Chart video and the availability of in person appointments. I also discussed with the patient that there may be a patient responsible charge related to this service. The patient expressed understanding and agreed to proceed.  Subjective:  Brianna Stein is a 29 y.o. [id number] at [redacted]w[redacted]d being followed for ongoing prenatal care.  She is currently monitored for the following issues for this low-risk pregnancy and has Supervision of other normal pregnancy, antepartum on their problem list.  Patient reports pain and nausea when she doesn't drink the 8 bottles of water everyday that she was previously prescribed to do. She states that she has "trouble drinking water." She prefers to drink Sprite all day, She admits to drinking "about 2 bottles a day." She reports the headaches she was having the last time we had a video has improved.  She states "my mom made me drink 8 bottles of water in a day and I had no pain or nausea that day.. The next day I didn't drink that much water and I felt really bad. My mom tells me that is why I am having pain and nausea." Reports fetal movement. "I feel him move much more than I used to and that makes me feel so much better." Denies any contractions, bleeding or leaking of fluid.   She requests refill for PNV Gummies, Flexeril and a Rx for iron supplement be sent to [facility] so she can pick up BP cuff, scale and meds from one pharmacy.   The following portions of the patient's history were reviewed and updated as appropriate: allergies, current medications, past family history, past medical history, past social history, past  surgical history and problem list.   Objective:   General:  Alert, oriented and cooperative.   Mental Status: Normal mood and affect perceived. Normal judgment and thought content.  Rest of physical exam deferred due to type of encounter  BP 138/84   Pulse (!) 106   Temp 97.7 F (36.5 C)   Wt 298 lb 12.8 oz (135.5 kg)   LMP 08/05/2018 (Within Weeks)   BMI 56.46 kg/m  **Patient had to come in to office to take VS and weight due to not picking up BP cuff at pharmacy and not owning a scale.  Assessment and Plan:  Pregnancy: [id number] at [redacted]w[redacted]d  1. Supervision of other normal pregnancy, antepartum - Long discussion about the importance of drinking more water in pregnancy. Negotiated with patient to at least commit to drinking 4 bottles of water to start with and slowly increasing to 6 then 8 bottles. Also negotiated with patient to replace 1 can/bottle of Sprite with 2 bottles of water. Advised she can use the sugar free water enhancers (like lemonade), if she prefers. - Prenatal Vit-Fe Phos-FA-Omega (VITAFOL GUMMIES) 3.33-0.333-34.8 MG CHEW; Chew 3 each by mouth daily.  Dispense: 90 tablet; Refill: 4 - cyclobenzaprine (FLEXERIL) 10 MG tablet; Take 1 tablet (10 mg total) by mouth every 8 (eight) hours as needed for muscle spasms.  Dispense: 30 tablet; Refill: 1 - Iron supplement Rx not sent in after review of patient's most recent CBC. My Chart message sent to notify patient of  normal HgB and will be rechecked at next visit. - Anticipatory guidance for 2 hr GTT and 3rd trimester labs at next visit in 4 wks  2. Sciatic nerve pain, unspecified laterality - Reports relief with Flexeril Rx. Requests refill - Refill sent  Preterm labor symptoms and general obstetric precautions including but not limited to vaginal bleeding, contractions, leaking of fluid and fetal movement were reviewed in detail with the patient.  I discussed the assessment and treatment plan with the patient. The patient was  provided an opportunity to ask questions and all were answered. The patient agreed with the plan and demonstrated an understanding of the instructions. The patient was advised to call back or seek an in-person office evaluation/go to MAU at [facility] for any urgent or concerning symptoms. Please refer to After Visit Summary for other counseling recommendations.   I provided 10 minutes of non-face-to-face time during this encounter. There was 5 minutes of chart review time spent prior to this encounter. Total time spent = 15 minutes.  Return in about 4 weeks (around 02/25/2019) for Return OB 2hr GTT.  Future Appointments  Date Time Provider Department Center  02/02/2019  9:45 AM [facility]-M[facility] NURSE [facility]-M[facility] MFC-US  02/02/2019  9:45 AM WH-MFC [place] 2 WH-MFCUS MFC-US  02/25/2019  8:10 AM Raelyn Mora, CNM [facility] None    Raelyn Mora, CNM Center for [employer], [facility]

## 2019-02-02 ENCOUNTER — Ambulatory Visit (HOSPITAL_COMMUNITY)
Admission: RE | Admit: 2019-02-02 | Discharge: 2019-02-02 | Disposition: A | Discharge status: Home / Self Care | Payer: Medicaid Other | Source: Ambulatory Visit | Attending: Maternal & Fetal Medicine | Admitting: Maternal & Fetal Medicine

## 2019-02-02 ENCOUNTER — Other Ambulatory Visit: Payer: Self-pay

## 2019-02-02 ENCOUNTER — Other Ambulatory Visit (HOSPITAL_COMMUNITY): Payer: Self-pay | Admitting: *Deleted

## 2019-02-02 ENCOUNTER — Ambulatory Visit (HOSPITAL_COMMUNITY): Payer: Medicaid Other

## 2019-02-02 ENCOUNTER — Ambulatory Visit (HOSPITAL_COMMUNITY): Payer: Medicaid Other | Admitting: *Deleted

## 2019-02-02 ENCOUNTER — Encounter (HOSPITAL_COMMUNITY): Payer: Self-pay

## 2019-02-02 VITALS — BP 105/72 | HR 104 | Temp 97.5°F

## 2019-02-02 DIAGNOSIS — Z362 Encounter for other antenatal screening follow-up: Secondary | ICD-10-CM

## 2019-02-02 DIAGNOSIS — O99210 Obesity complicating pregnancy, unspecified trimester: Secondary | ICD-10-CM | POA: Diagnosis present

## 2019-02-02 DIAGNOSIS — E669 Obesity, unspecified: Secondary | ICD-10-CM | POA: Diagnosis not present

## 2019-02-02 DIAGNOSIS — O99212 Obesity complicating pregnancy, second trimester: Secondary | ICD-10-CM

## 2019-02-02 DIAGNOSIS — Z3A23 23 weeks gestation of pregnancy: Secondary | ICD-10-CM

## 2019-02-02 DIAGNOSIS — Z3480 Encounter for supervision of other normal pregnancy, unspecified trimester: Secondary | ICD-10-CM | POA: Insufficient documentation

## 2019-02-03 ENCOUNTER — Ambulatory Visit (HOSPITAL_COMMUNITY): Payer: Medicaid Other

## 2019-02-16 ENCOUNTER — Telehealth: Payer: Self-pay | Admitting: *Deleted

## 2019-02-16 NOTE — Telephone Encounter (Signed)
Patient called stating she is having braxton hicks. Patient was not sure if it was normal to have braxton hicks at this stage of pregnancy. Patient also reported pain with sex. Advised patient that it is normal to have braxton hicks at this stage and pain with sex. Patient denies any vaginal bleeding. Advised patient to take extra strength Tylenol, warm bath, lay on left side for a hour and drink plenty of water. If she have 5 or more contractions in a hour to go to MAU or if there is no relief with Tylenol, bath, increase in fluids and laying on left side.   Clovis Pu, RN

## 2019-02-25 ENCOUNTER — Ambulatory Visit (INDEPENDENT_AMBULATORY_CARE_PROVIDER_SITE_OTHER): Payer: Medicaid Other | Admitting: Obstetrics and Gynecology

## 2019-02-25 ENCOUNTER — Other Ambulatory Visit: Payer: Self-pay

## 2019-02-25 ENCOUNTER — Encounter: Payer: Self-pay | Admitting: General Practice

## 2019-02-25 ENCOUNTER — Encounter: Payer: Self-pay | Admitting: Obstetrics and Gynecology

## 2019-02-25 VITALS — BP 90/65 | HR 100 | Temp 97.3°F | Wt 297.0 lb

## 2019-02-25 DIAGNOSIS — Z23 Encounter for immunization: Secondary | ICD-10-CM | POA: Diagnosis not present

## 2019-02-25 DIAGNOSIS — Z3480 Encounter for supervision of other normal pregnancy, unspecified trimester: Secondary | ICD-10-CM

## 2019-02-25 DIAGNOSIS — M5430 Sciatica, unspecified side: Secondary | ICD-10-CM

## 2019-02-25 DIAGNOSIS — Z3A26 26 weeks gestation of pregnancy: Secondary | ICD-10-CM

## 2019-02-25 DIAGNOSIS — O99891 Other specified diseases and conditions complicating pregnancy: Secondary | ICD-10-CM

## 2019-02-25 NOTE — Patient Instructions (Signed)
Fetal Movement Counts Patient Name: ________________________________________________ Patient Due Date: ____________________ What is a fetal movement count?  A fetal movement count is the number of times that you feel your baby move during a certain amount of time. This may also be called a fetal kick count. A fetal movement count is recommended for every pregnant woman. You may be asked to start counting fetal movements as early as week 28 of your pregnancy. Pay attention to when your baby is most active. You may notice your baby's sleep and wake cycles. You may also notice things that make your baby move more. You should do a fetal movement count:  When your baby is normally most active.  At the same time each day. A good time to count movements is while you are resting, after having something to eat and drink. How do I count fetal movements? 1. Find a quiet, comfortable area. Sit, or lie down on your side. 2. Write down the date, the start time and stop time, and the number of movements that you felt between those two times. Take this information with you to your health care visits. 3. Write down your start time when you feel the first movement. 4. Count kicks, flutters, swishes, rolls, and jabs. You should feel at least 10 movements. 5. You may stop counting after you have felt 10 movements, or if you have been counting for 2 hours. Write down the stop time. 6. If you do not feel 10 movements in 2 hours, contact your health care provider for further instructions. Your health care provider may want to do additional tests to assess your baby's well-being. Contact a health care provider if:  You feel fewer than 10 movements in 2 hours.  Your baby is not moving like he or she usually does. Date: ____________ Start time: ____________ Stop time: ____________ Movements: ____________ Date: ____________ Start time: ____________ Stop time: ____________ Movements: ____________ Date: ____________  Start time: ____________ Stop time: ____________ Movements: ____________ Date: ____________ Start time: ____________ Stop time: ____________ Movements: ____________ Date: ____________ Start time: ____________ Stop time: ____________ Movements: ____________ Date: ____________ Start time: ____________ Stop time: ____________ Movements: ____________ Date: ____________ Start time: ____________ Stop time: ____________ Movements: ____________ Date: ____________ Start time: ____________ Stop time: ____________ Movements: ____________ Date: ____________ Start time: ____________ Stop time: ____________ Movements: ____________ This information is not intended to replace advice given to you by your health care provider. Make sure you discuss any questions you have with your health care provider. Document Revised: 08/27/2018 Document Reviewed: 08/27/2018 Elsevier Patient Education  2020 [employer] Inc[employer] Iron-Rich Diet  Iron is a mineral that helps your body to produce hemoglobin. Hemoglobin is a protein in red blood cells that carries oxygen to your body's tissues. Eating too little iron may cause you to feel weak and tired, and it can increase your risk of infection. Iron is naturally found in many foods, and many foods have iron added to them (iron-fortified foods). You may need to follow an iron-rich diet if you do not have enough iron in your body due to certain medical conditions. The amount of iron that you need each day depends on your age, your sex, and any medical conditions you have. Follow instructions from your health care provider or a diet and nutrition specialist (dietitian) about how much iron you should eat each day. What are tips for following this plan? Reading food labels  Check food labels to see how many milligrams (mg) of iron are in each   serving. Cooking  Cook foods in pots and pans that are made from iron.  Take these steps to make it easier for your body to absorb iron from certain  foods: ? Soak beans overnight before cooking. ? Soak whole grains overnight and drain them before using. ? Ferment flours before baking, such as by using yeast in bread dough. Meal planning  When you eat foods that contain iron, you should eat them with foods that are high in vitamin C. These include oranges, peppers, tomatoes, potatoes, and mango. Vitamin C helps your body to absorb iron. General information  Take iron supplements only as told by your health care provider. An overdose of iron can be life-threatening. If you were prescribed iron supplements, take them with orange juice or a vitamin C supplement.  When you eat iron-fortified foods or take an iron supplement, you should also eat foods that naturally contain iron, such as meat, poultry, and fish. Eating naturally iron-rich foods helps your body to absorb the iron that is added to other foods or contained in a supplement.  Certain foods and drinks prevent your body from absorbing iron properly. Avoid eating these foods in the same meal as iron-rich foods or with iron supplements. These foods include: ? Coffee, black tea, and red wine. ? Milk, dairy products, and foods that are high in calcium. ? Beans and soybeans. ? Whole grains. What foods should I eat? Fruits Prunes. Raisins. Eat fruits high in vitamin C, such as oranges, grapefruits, and strawberries, alongside iron-rich foods. Vegetables Spinach (cooked). Green peas. Broccoli. Fermented vegetables. Eat vegetables high in vitamin C, such as leafy greens, potatoes, bell peppers, and tomatoes, alongside iron-rich foods. Grains Iron-fortified breakfast cereal. Iron-fortified whole-wheat bread. Enriched rice. Sprouted grains. Meats and other proteins Beef liver. Oysters. Beef. Shrimp. Turkey. Chicken. Tuna. Sardines. Chickpeas. Nuts. Tofu. Pumpkin seeds. Beverages Tomato juice. Fresh orange juice. Prune juice. Hibiscus tea. Fortified instant breakfast shakes. Sweets and  desserts Blackstrap molasses. Seasonings and condiments Tahini. Fermented soy sauce. Other foods Wheat germ. The items listed above may not be a complete list of recommended foods and beverages. Contact a dietitian for more information. What foods should I avoid? Grains Whole grains. Bran cereal. Bran flour. Oats. Meats and other proteins Soybeans. Products made from soy protein. Black beans. Lentils. Mung beans. Split peas. Dairy Milk. Cream. Cheese. Yogurt. Cottage cheese. Beverages Coffee. Black tea. Red wine. Sweets and desserts Cocoa. Chocolate. Ice cream. Other foods Basil. Oregano. Large amounts of parsley. The items listed above may not be a complete list of foods and beverages to avoid. Contact a dietitian for more information. Summary  Iron is a mineral that helps your body to produce hemoglobin. Hemoglobin is a protein in red blood cells that carries oxygen to your body's tissues.  Iron is naturally found in many foods, and many foods have iron added to them (iron-fortified foods).  When you eat foods that contain iron, you should eat them with foods that are high in vitamin C. Vitamin C helps your body to absorb iron.  Certain foods and drinks prevent your body from absorbing iron properly, such as whole grains and dairy products. You should avoid eating these foods in the same meal as iron-rich foods or with iron supplements. This information is not intended to replace advice given to you by your health care provider. Make sure you discuss any questions you have with your health care provider. Document Revised: 12/20/2016 Document Reviewed: 12/03/2016 Elsevier Patient Education  2020 Elsevier   Inc.  

## 2019-02-25 NOTE — Progress Notes (Signed)
  HIGH-RISK PREGNANCY OFFICE VISIT Patient name: Brianna Stein MRN [id number]  Date of birth: 12/09/1990 Chief Complaint:   Routine Prenatal Visit  History of Present Illness:   Brianna Stein is a 29 y.o. [id number] female at [redacted]w[redacted]d with an Estimated Date of Delivery: 06/01/19 being seen today for ongoing management of a high-risk pregnancy complicated by possible fetal VSD Today she reports occasional contractions. She states she "may have 5 contractions/day. Contractions: Irregular. Vag. Bleeding: None.  Movement: Present. denies leaking of fluid.  Review of Systems:   Pertinent items are noted in HPI Denies abnormal vaginal discharge w/ itching/odor/irritation, headaches, visual changes, shortness of breath, chest pain, abdominal pain, severe nausea/vomiting, or problems with urination or bowel movements unless otherwise stated above. Pertinent History Reviewed:  Reviewed past medical,surgical, social, obstetrical and family history.  Reviewed problem list, medications and allergies. Physical Assessment:   Vitals:   02/25/19 0813  BP: 90/65  Pulse: 100  Temp: (!) 97.3 F (36.3 C)  Weight: 297 lb (134.7 kg)  Body mass index is 56.12 kg/m.           Physical Examination:   General appearance: alert, well appearing, and in no distress  Mental status: alert, oriented to person, place, and time  Skin: warm & dry   Extremities: Edema: None    Cardiovascular: normal heart rate noted  Respiratory: normal respiratory effort, no distress  Abdomen: gravid, soft, non-tender  Pelvic: Cervical exam deferred         Fetal Status: Fetal Heart Rate (bpm): 146 Fundal Height: 32 cm Movement: Present Presentation: Undeterminable  Fetal Surveillance Testing today: none   No results found for this or any previous visit (from the past 24 hour(s)).  Assessment & Plan:  1) High-risk pregnancy [id number] at [redacted]w[redacted]d with an Estimated Date of Delivery: 06/01/19  - Glucose Tolerance, 2 Hours w/1 Hour,  - HIV  Antibody (routine testing w rflx),  - RPR,  - CBC,  - Tdap vaccine greater than or equal to 7yo IM  Sciatic nerve pain, unspecified laterality  - Ambulatory referral to Physical Therapy  Need for tetanus, diphtheria, and acellular pertussis (Tdap) vaccine in patient of adolescent age or older  - Tdap vaccine greater than or equal to 7yo IM    Meds: No orders of the defined types were placed in this encounter.   Labs/procedures today: 2hr GTT, 3rd trimester labs, BTL consent signed  Treatment Plan:  Serial growth U/S with MFM until delivery  Reviewed: Preterm labor symptoms and general obstetric precautions including but not limited to vaginal bleeding, contractions, leaking of fluid and fetal movement were reviewed in detail with the patient.  All questions were answered. Has home bp cuff.Check bp weekly, let us know if >140/90.   Follow-up: Return in about 6 weeks (around 04/08/2019) for Return OB - My Chart video.  Orders Placed This Encounter  Procedures  . Tdap vaccine greater than or equal to 7yo IM  . Glucose Tolerance, 2 Hours w/1 Hour  . HIV Antibody (routine testing w rflx)  . RPR  . CBC   Raelyn Mora MSN, CNM 02/25/2019 4:41 PM

## 2019-02-26 LAB — CBC
Hematocrit: 34.6 % (ref 34.0–46.6)
Hemoglobin: 11.6 g/dL (ref 11.1–15.9)
MCH: 28.2 pg (ref 26.6–33.0)
MCHC: 33.5 g/dL (ref 31.5–35.7)
MCV: 84 fL (ref 79–97)
Platelets: 260 10*3/uL (ref 150–450)
RBC: 4.12 x10E6/uL (ref 3.77–5.28)
RDW: 13.9 % (ref 11.7–15.4)
WBC: 9.5 10*3/uL (ref 3.4–10.8)

## 2019-02-26 LAB — GLUCOSE TOLERANCE, 2 HOURS W/ 1HR
Glucose, 1 hour: 141 mg/dL (ref 65–179)
Glucose, 2 hour: 77 mg/dL (ref 65–152)
Glucose, Fasting: 76 mg/dL (ref 65–91)

## 2019-02-26 LAB — HIV ANTIBODY (ROUTINE TESTING W REFLEX): HIV Screen 4th Generation wRfx: NONREACTIVE

## 2019-02-26 LAB — RPR: RPR Ser Ql: NONREACTIVE

## 2019-03-05 ENCOUNTER — Encounter: Payer: Self-pay | Admitting: Obstetrics and Gynecology

## 2019-03-05 DIAGNOSIS — M5430 Sciatica, unspecified side: Secondary | ICD-10-CM

## 2019-03-09 ENCOUNTER — Encounter (HOSPITAL_COMMUNITY): Payer: Self-pay

## 2019-03-09 ENCOUNTER — Other Ambulatory Visit (HOSPITAL_COMMUNITY): Payer: Self-pay | Admitting: *Deleted

## 2019-03-09 ENCOUNTER — Other Ambulatory Visit: Payer: Self-pay

## 2019-03-09 ENCOUNTER — Ambulatory Visit (HOSPITAL_COMMUNITY): Payer: Medicaid Other | Admitting: *Deleted

## 2019-03-09 ENCOUNTER — Ambulatory Visit (HOSPITAL_COMMUNITY)
Admission: RE | Admit: 2019-03-09 | Discharge: 2019-03-09 | Disposition: A | Discharge status: Home / Self Care | Payer: Medicaid Other | Source: Ambulatory Visit | Attending: Obstetrics and Gynecology | Admitting: Obstetrics and Gynecology

## 2019-03-09 DIAGNOSIS — Z362 Encounter for other antenatal screening follow-up: Secondary | ICD-10-CM | POA: Insufficient documentation

## 2019-03-09 DIAGNOSIS — Z3A28 28 weeks gestation of pregnancy: Secondary | ICD-10-CM | POA: Diagnosis not present

## 2019-03-09 DIAGNOSIS — Z3480 Encounter for supervision of other normal pregnancy, unspecified trimester: Secondary | ICD-10-CM | POA: Insufficient documentation

## 2019-03-25 ENCOUNTER — Telehealth: Payer: Self-pay | Admitting: *Deleted

## 2019-03-25 DIAGNOSIS — Z3480 Encounter for supervision of other normal pregnancy, unspecified trimester: Secondary | ICD-10-CM

## 2019-03-25 MED ORDER — CYCLOBENZAPRINE HCL 10 MG PO TABS: 10.0000 mg | ORAL_TABLET | Freq: Three times a day (TID) | ORAL | 1 refills | Status: DC | PRN

## 2019-03-25 NOTE — Telephone Encounter (Signed)
-----   Message from Marti Sleigh, [place] sent at 03/25/2019 11:11 AM EST ----- Regarding: Meds Need muscle spasm meds.  I told her that we would send a [facility] message when sent.

## 2019-03-26 ENCOUNTER — Other Ambulatory Visit: Payer: Self-pay

## 2019-03-26 ENCOUNTER — Encounter (HOSPITAL_COMMUNITY): Payer: Self-pay | Admitting: Obstetrics & Gynecology

## 2019-03-26 ENCOUNTER — Inpatient Hospital Stay (HOSPITAL_COMMUNITY)
Admission: AD | Admit: 2019-03-26 | Discharge: 2019-03-26 | Disposition: A | Discharge status: Home / Self Care | Payer: Medicaid Other | Attending: Obstetrics & Gynecology | Admitting: Obstetrics & Gynecology

## 2019-03-26 DIAGNOSIS — O212 Late vomiting of pregnancy: Secondary | ICD-10-CM | POA: Insufficient documentation

## 2019-03-26 DIAGNOSIS — B3731 Acute candidiasis of vulva and vagina: Secondary | ICD-10-CM

## 2019-03-26 DIAGNOSIS — Z3A30 30 weeks gestation of pregnancy: Secondary | ICD-10-CM | POA: Diagnosis not present

## 2019-03-26 DIAGNOSIS — B373 Candidiasis of vulva and vagina: Secondary | ICD-10-CM | POA: Insufficient documentation

## 2019-03-26 DIAGNOSIS — O219 Vomiting of pregnancy, unspecified: Secondary | ICD-10-CM

## 2019-03-26 DIAGNOSIS — O479 False labor, unspecified: Secondary | ICD-10-CM

## 2019-03-26 DIAGNOSIS — O98813 Other maternal infectious and parasitic diseases complicating pregnancy, third trimester: Secondary | ICD-10-CM | POA: Insufficient documentation

## 2019-03-26 DIAGNOSIS — O4703 False labor before 37 completed weeks of gestation, third trimester: Secondary | ICD-10-CM | POA: Insufficient documentation

## 2019-03-26 DIAGNOSIS — N949 Unspecified condition associated with female genital organs and menstrual cycle: Secondary | ICD-10-CM

## 2019-03-26 LAB — URINALYSIS, ROUTINE W REFLEX MICROSCOPIC
Bilirubin Urine: NEGATIVE
Glucose, UA: NEGATIVE mg/dL
Hgb urine dipstick: NEGATIVE
Ketones, ur: 5 mg/dL — AB
Nitrite: NEGATIVE
Protein, ur: NEGATIVE mg/dL
Specific Gravity, Urine: 1.02 (ref 1.005–1.030)
pH: 6 (ref 5.0–8.0)

## 2019-03-26 LAB — WET PREP, GENITAL
Clue Cells Wet Prep HPF POC: NONE SEEN
Sperm: NONE SEEN
Trich, Wet Prep: NONE SEEN

## 2019-03-26 MED ORDER — FAMOTIDINE IN NACL 20-0.9 MG/50ML-% IV SOLN
20.0000 mg | Freq: Once | INTRAVENOUS | Status: AC
  Administered 2019-03-26: 20 mg via INTRAVENOUS
  Filled 2019-03-26: qty 50

## 2019-03-26 MED ORDER — SODIUM CHLORIDE 0.9 % IV SOLN
8.0000 mg | Freq: Once | INTRAVENOUS | Status: AC
  Administered 2019-03-26: 8 mg via INTRAVENOUS
  Filled 2019-03-26: qty 4

## 2019-03-26 MED ORDER — TERCONAZOLE 0.4 % VA CREA: 1.0000 | TOPICAL_CREAM | Freq: Every day | VAGINAL | 0 refills | Status: DC

## 2019-03-26 MED ORDER — ACETAMINOPHEN 500 MG PO TABS
1000.0000 mg | ORAL_TABLET | Freq: Once | ORAL | Status: AC
  Administered 2019-03-26: 1000 mg via ORAL
  Filled 2019-03-26: qty 2

## 2019-03-26 MED ORDER — ONDANSETRON HCL 4 MG PO TABS: 4.0000 mg | ORAL_TABLET | Freq: Every day | ORAL | 1 refills | Status: DC | PRN

## 2019-03-26 MED ORDER — LACTATED RINGERS IV BOLUS
1000.0000 mL | Freq: Once | INTRAVENOUS | Status: AC
  Administered 2019-03-26: 1000 mL via INTRAVENOUS

## 2019-03-26 NOTE — MAU Note (Signed)
Been having like real bad pressure in vaginal area, started like a wk ago, comes and goes.  Can't roll over with out having pain. Been contracting off and on.  Hasn't been able to keep nothing down the last 3 days.

## 2019-03-26 NOTE — Progress Notes (Signed)
Pt prescribed Flexiril. Took yesterday and said it made her fall asleep.

## 2019-03-26 NOTE — MAU Provider Note (Signed)
History     CSN: [id number]  Arrival date and time: 03/26/19 1059   First Provider Initiated Contact with Patient 03/26/19 1246      Chief Complaint  Patient presents with  . Vaginal Pain  . Abdominal Pain  . Contractions  . Emesis  . pelvic pressure    HPI   Ms.Brianna Stein is a 29 y.o. female here with contractions and N/V. The contractions have been present for 3 weeks. The contractions come and go and they do not come everyday. Today she has felt contractions 5x today. States it hurts to walk and she is feeling pain in her vagina. States when she goes to the grocery store she has to ride on a scooter. She is wanting to know if she needs to be on bedrest. The N/V started 3 days ago and she has not been able to keep anything down. States she is not even able to keep a meal down in 3 days. No diarrhea. + fetal movement. She has tried Zofran which she has not been able to keep down.   OB History    Gravida  7   Para  5   Term  4   Preterm  1   AB  1   Living  5     SAB  1   TAB      Ectopic      Multiple      Live Births  5           Past Medical History:  Diagnosis Date  . Medical history non-contributory     Past Surgical History:  Procedure Laterality Date  . NO PAST SURGERIES      No family history on file.  Social History   Tobacco Use  . Smoking status: Never Smoker  . Smokeless tobacco: Never Used  Substance Use Topics  . Alcohol use: Never  . Drug use: Never    Allergies: No Known Allergies  Medications Prior to Admission  Medication Sig Dispense Refill Last Dose  . acetaminophen (TYLENOL) 325 MG tablet Take 650 mg by mouth every 6 (six) hours as needed for mild pain or headache.   03/25/2019 at Unknown time  . cyclobenzaprine (FLEXERIL) 10 MG tablet Take 1 tablet (10 mg total) by mouth every 8 (eight) hours as needed for muscle spasms. 30 tablet 1 03/25/2019 at Unknown time  . Prenatal Vit-Fe Phos-FA-Omega (VITAFOL GUMMIES)  3.33-0.333-34.8 MG CHEW Chew 3 each by mouth daily. 90 tablet 4 03/25/2019 at Unknown time  . Blood Pressure Monitoring (BLOOD PRESSURE MONITOR AUTOMAT) DEVI 1 Device by Does not apply route daily. Automatic blood pressure large cuff. To take blood pressure regularly at home. ICD-10 code: O09.90 1 each 0   . metroNIDAZOLE (FLAGYL) 500 MG tablet Take 1 tablet (500 mg total) by mouth 2 (two) times daily. (Patient not taking: Reported on 12/31/2018) 14 tablet 0   . Misc. Devices (GOJJI WEIGHT SCALE) MISC 1 Device by Does not apply route daily as needed. Weight self daily at home as needed. ICD-10 code: O09.90 1 each 0   . ondansetron (ZOFRAN ODT) 8 MG disintegrating tablet Take 1 tablet (8 mg total) by mouth every 8 (eight) hours as needed for nausea or vomiting. (Patient not taking: Reported on 12/31/2018) 20 tablet 0    Results for orders placed or performed during the hospital encounter of 03/26/19 (from the past 48 hour(s))  Urinalysis, Routine w reflex microscopic     Status: Abnormal  Collection Time: 03/26/19  1:02 PM  Result Value Ref Range   Color, Urine AMBER (A) YELLOW    Comment: BIOCHEMICALS MAY BE AFFECTED BY COLOR   APPearance HAZY (A) CLEAR   Specific Gravity, Urine 1.020 1.005 - 1.030   pH 6.0 5.0 - 8.0   Glucose, UA NEGATIVE NEGATIVE mg/dL   Hgb urine dipstick NEGATIVE NEGATIVE   Bilirubin Urine NEGATIVE NEGATIVE   Ketones, ur 5 (A) NEGATIVE mg/dL   Protein, ur NEGATIVE NEGATIVE mg/dL   Nitrite NEGATIVE NEGATIVE   Leukocytes,Ua SMALL (A) NEGATIVE   RBC / HPF 0-5 0 - 5 RBC/hpf   WBC, UA 0-5 0 - 5 WBC/hpf   Bacteria, UA RARE (A) NONE SEEN   Squamous Epithelial / LPF 0-5 0 - 5   Mucus PRESENT     Comment: Performed at [facility], [address], [place], [place] [address]  Wet prep, genital     Status: Abnormal   Collection Time: 03/26/19  1:19 PM   Specimen: Vaginal  Result Value Ref Range   Yeast Wet Prep HPF POC PRESENT (A) NONE SEEN   Trich, Wet Prep NONE SEEN  NONE SEEN   Clue Cells Wet Prep HPF POC NONE SEEN NONE SEEN   WBC, Wet Prep HPF POC MODERATE (A) NONE SEEN   Sperm NONE SEEN     Comment: Performed at [facility], [address], [place], [place] [address]   Review of Systems  Gastrointestinal: Positive for abdominal pain and nausea. Negative for vomiting.  Genitourinary: Positive for pelvic pain and vaginal pain. Negative for dysuria.   Physical Exam   Blood pressure 115/70, pulse (!) 109, temperature 98.5 F (36.9 C), temperature source Oral, resp. rate 18, height 5\' 1"  (1.549 m), weight 133 kg, last menstrual period 08/05/2018, SpO2 99 %.  Physical Exam  Constitutional: She is oriented to person, place, and time. She appears well-developed and well-nourished. No distress.  HENT:  Head: Normocephalic.  Genitourinary:    Genitourinary Comments: Vagina - Small amount of white, thick,  vaginal discharge, no odor  Cervix - No contact bleeding, no active bleeding  Bimanual exam: Dilation: Closed Effacement (%): Thick Exam by:: J Montoya Brandel NP wet prep done Chaperone present for exam.    Musculoskeletal:        General: Normal range of motion.  Neurological: She is alert and oriented to person, place, and time.  Skin: Skin is warm. She is not diaphoretic.  Psychiatric: Her behavior is normal.   Fetal Tracing: Baseline: 130 bpm Variability: Moderate  Accelerations: 15x15 Decelerations: None Toco: UI  MAU Course  Procedures  None  MDM  Tylenol given 1 gram.  Urine shows 5 ketones LR bolus X 1 Pepcid and Zofran given  Patient tolerating PO fluids. And ready to go home. Cervix is closed.   Assessment and Plan   A:  1. Nausea and vomiting in pregnancy   2. Braxton Hicks contractions   3. Round ligament pain   4. [redacted] weeks gestation of pregnancy   5. Vaginal yeast infection      P:  Discharge home with strict return precautions Pelvic rest Recommend pregnancy support belt Rx: Terazol, Zofran oral  tabs Return to MAU if symptoms worsen Increase oral fluid intake    Bayan Hedstrom, [phone], NP 03/26/2019 9:25 PM

## 2019-03-26 NOTE — Discharge Instructions (Signed)
Nausea and Vomiting, Adult Nausea is feeling sick to your stomach or feeling that you are about to throw up (vomit). Vomiting is when food in your stomach is thrown up and out of the mouth. Throwing up can make you feel weak. It can also make you lose too much water in your body (get dehydrated). If you lose too much water in your body, you may:  Feel tired.  Feel thirsty.  Have a dry mouth.  Have cracked lips.  Go pee (urinate) less often. Older adults and people with other diseases or a weak body defense system (immune system) are at higher risk for losing too much water in the body. If you feel sick to your stomach and you throw up, it is important to follow instructions from your doctor about how to take care of yourself. Follow these instructions at home: Watch your symptoms for any changes. Tell your doctor about them. Follow these instructions to care for yourself at home. Eating and drinking      Take an ORS (oral rehydration solution). This is a drink that is sold at pharmacies and stores.  Drink clear fluids in small amounts as you are able, such as: ? Water. ? Ice chips. ? Fruit juice that has water added (diluted fruit juice). ? Low-calorie sports drinks.  Eat bland, easy-to-digest foods in small amounts as you are able, such as: ? Bananas. ? Applesauce. ? Rice. ? Low-fat (lean) meats. ? Toast. ? Crackers.  Avoid drinking fluids that have a lot of sugar or caffeine in them. This includes energy drinks, sports drinks, and soda.  Avoid alcohol.  Avoid spicy or fatty foods. General instructions  Take over-the-counter and prescription medicines only as told by your doctor.  Drink enough fluid to keep your pee (urine) pale yellow.  Wash your hands often with soap and water. If you cannot use soap and water, use hand sanitizer.  Make sure that all people in your home wash their hands well and often.  Rest at home while you get better.  Watch your condition  for any changes.  Take slow and deep breaths when you feel sick to your stomach.  Keep all follow-up visits as told by your doctor. This is important. Contact a doctor if:  Your symptoms get worse.  You have new symptoms.  You have a fever.  You cannot drink fluids without throwing up.  You feel sick to your stomach for more than 2 days.  You feel light-headed or dizzy.  You have a headache.  You have muscle cramps.  You have a rash.  You have pain while peeing. Get help right away if:  You have pain in your chest, neck, arm, or jaw.  You feel very weak or you pass out (faint).  You throw up again and again.  You have throw up that is bright red or looks like black coffee grounds.  You have bloody or black poop (stools) or poop that looks like tar.  You have a very bad headache, a stiff neck, or both.  You have very bad pain, cramping, or bloating in your belly (abdomen).  You have trouble breathing.  You are breathing very quickly.  Your heart is beating very quickly.  Your skin feels cold and clammy.  You feel confused.  You have signs of losing too much water in your body, such as: ? Dark pee, very little pee, or no pee. ? Cracked lips. ? Dry mouth. ? Sunken eyes. ?   Sleepiness. ? Weakness. These symptoms may be an emergency. Do not wait to see if the symptoms will go away. Get medical help right away. Call your local emergency services (911 in the [place] Do not drive yourself to the hospital. Summary  Nausea is feeling sick to your stomach or feeling that you are about to throw up (vomit). Vomiting is when food in your stomach is thrown up and out of the mouth.  Follow instructions from your doctor about eating and drinking to keep from losing too much water in your body.  Take over-the-counter and prescription medicines only as told by your doctor.  Contact your doctor if your symptoms get worse or you have new symptoms.  Keep all follow-up  visits as told by your doctor. This is important. This information is not intended to replace advice given to you by your health care provider. Make sure you discuss any questions you have with your health care provider. Document Revised: 05/01/2018 Document Reviewed: 06/17/2017 Elsevier Patient Education  2020 Elsevier Inc[employer]  

## 2019-03-27 LAB — CULTURE, OB URINE
Culture: >=100000 — AB
Special Requests: NORMAL

## 2019-04-06 ENCOUNTER — Other Ambulatory Visit (HOSPITAL_COMMUNITY): Payer: Self-pay | Admitting: *Deleted

## 2019-04-06 ENCOUNTER — Ambulatory Visit (HOSPITAL_COMMUNITY)
Admission: RE | Admit: 2019-04-06 | Discharge: 2019-04-06 | Disposition: A | Discharge status: Home / Self Care | Payer: Medicaid Other | Source: Ambulatory Visit | Attending: Obstetrics and Gynecology | Admitting: Obstetrics and Gynecology

## 2019-04-06 ENCOUNTER — Encounter (HOSPITAL_COMMUNITY): Payer: Self-pay

## 2019-04-06 ENCOUNTER — Ambulatory Visit (HOSPITAL_COMMUNITY): Payer: Medicaid Other | Admitting: *Deleted

## 2019-04-06 ENCOUNTER — Other Ambulatory Visit: Payer: Self-pay

## 2019-04-06 VITALS — BP 103/62 | HR 113 | Temp 98.1°F

## 2019-04-06 DIAGNOSIS — Z3A32 32 weeks gestation of pregnancy: Secondary | ICD-10-CM | POA: Diagnosis not present

## 2019-04-06 DIAGNOSIS — O99213 Obesity complicating pregnancy, third trimester: Secondary | ICD-10-CM

## 2019-04-06 DIAGNOSIS — Z6841 Body Mass Index (BMI) 40.0 and over, adult: Secondary | ICD-10-CM

## 2019-04-06 DIAGNOSIS — Z3480 Encounter for supervision of other normal pregnancy, unspecified trimester: Secondary | ICD-10-CM | POA: Diagnosis present

## 2019-04-06 DIAGNOSIS — E669 Obesity, unspecified: Secondary | ICD-10-CM

## 2019-04-06 DIAGNOSIS — O0990 Supervision of high risk pregnancy, unspecified, unspecified trimester: Secondary | ICD-10-CM | POA: Diagnosis present

## 2019-04-06 DIAGNOSIS — Z362 Encounter for other antenatal screening follow-up: Secondary | ICD-10-CM | POA: Diagnosis not present

## 2019-04-07 NOTE — Progress Notes (Deleted)
   LOW-RISK PREGNANCY OFFICE VISIT Patient name: Kateleen Encarnacion MRN [id number]  Date of birth: 11-Jun-1990 Chief Complaint:   No chief complaint on file.  History of Present Illness:   Brianna Stein is a 29 y.o. [phone] female at [redacted]w[redacted]d with an Estimated Date of Delivery: 06/01/19 being seen today for ongoing management of a low-risk pregnancy.  Today she reports {sx:14538}.  .  .   . {Actions; denies-reports:120008} leaking of fluid. Review of Systems:   Pertinent items are noted in HPI Denies abnormal vaginal discharge w/ itching/odor/irritation, headaches, visual changes, shortness of breath, chest pain, abdominal pain, severe nausea/vomiting, or problems with urination or bowel movements unless otherwise stated above. Pertinent History Reviewed:  Reviewed past medical,surgical, social, obstetrical and family history.  Reviewed problem list, medications and allergies. Physical Assessment:  There were no vitals filed for this visit.There is no height or weight on file to calculate BMI.        Physical Examination:   General appearance: Well appearing, and in no distress  Mental status: Alert, oriented to person, place, and time  Skin: Warm & dry  Cardiovascular: Normal heart rate noted  Respiratory: Normal respiratory effort, no distress  Abdomen: Soft, gravid, nontender  Pelvic: {Blank single:19197::"Cervical exam performed","Cervical exam deferred"}         Extremities:    Fetal Status:          No results found for this or any previous visit (from the past 24 hour(s)).  Assessment & Plan:  1) Low-risk pregnancy [id number] at [redacted]w[redacted]d with an Estimated Date of Delivery: 06/01/19   2) ***, ***   Meds: No orders of the defined types were placed in this encounter.  Labs/procedures today: ***  Plan:  Continue routine obstetrical care ***  Reviewed: {Blank single:19197::"Term","Preterm"} labor symptoms and general obstetric precautions including but not limited to vaginal bleeding,  contractions, leaking of fluid and fetal movement were reviewed in detail with the patient.  All questions were answered. *** home bp cuff. Rx faxed to ***. Check bp weekly, let us know if >140/90.   Follow-up: No follow-ups on file.  No orders of the defined types were placed in this encounter.  Raelyn Mora MSN, CNM 04/07/2019 9:54 PM

## 2019-04-08 ENCOUNTER — Telehealth (INDEPENDENT_AMBULATORY_CARE_PROVIDER_SITE_OTHER): Payer: Medicaid Other | Admitting: Obstetrics and Gynecology

## 2019-04-08 ENCOUNTER — Encounter: Payer: Self-pay | Admitting: Obstetrics and Gynecology

## 2019-04-08 VITALS — BP 136/68 | HR 106 | Wt 290.5 lb

## 2019-04-08 DIAGNOSIS — Z3A32 32 weeks gestation of pregnancy: Secondary | ICD-10-CM

## 2019-04-08 DIAGNOSIS — Z3483 Encounter for supervision of other normal pregnancy, third trimester: Secondary | ICD-10-CM

## 2019-04-08 DIAGNOSIS — Z3480 Encounter for supervision of other normal pregnancy, unspecified trimester: Secondary | ICD-10-CM

## 2019-04-08 NOTE — Progress Notes (Addendum)
   MY CHART VIDEO VIRTUAL OBSTETRICS VISIT ENCOUNTER NOTE  I connected with Brianna Stein on 04/08/19 at  1:10 PM EDT by My Chart video while shopping at [facility] and verified that I am speaking with the correct person using two identifiers.   I discussed the limitations, risks, security and privacy concerns of performing an evaluation and management service by My Chart video and the availability of in person appointments. I also discussed with the patient that there may be a patient responsible charge related to this service. The patient expressed understanding and agreed to proceed.  Subjective:  Brianna Stein is a 29 y.o. [id number] at [redacted]w[redacted]d being followed for ongoing prenatal care.  She is currently monitored for the following issues for this low-risk pregnancy and has Supervision of other normal pregnancy, antepartum on their problem list.  Patient reports backache and occasional contractions. She states that she "had so much pain last night that she had to take Flexeril, but (she) tries not to use it that much." She admits to only having 1 bottle of water to drink today. Reports fetal movement. Denies any contractions, bleeding or leaking of fluid.   The following portions of the patient's history were reviewed and updated as appropriate: allergies, current medications, past family history, past medical history, past social history, past surgical history and problem list.   Objective:   General:  Alert, oriented and cooperative.   Mental Status: Normal mood and affect perceived. Normal judgment and thought content.  Rest of physical exam deferred due to type of encounter  BP 136/68   Pulse (!) 106   Wt 290 lb 8 oz (131.8 kg)   LMP 08/05/2018 (Within Weeks)   BMI 54.89 kg/m  **BP & weight will be done when patient gets home from shopping -- will send VS and weight via My Chart  Assessment and Plan:  Pregnancy: [id number] at [redacted]w[redacted]d  1. Supervision of other normal pregnancy, antepartum -  Discussed the importance of staying well-hydration - Advised that she will need to drink 32 ounces of water when she gets home and rest. If she starts to have 6 or more painful contractions in 1 hour to go to MAU for PTL evaluation. - Advised to continue with Flexeril and Tylenol prn  Preterm labor symptoms and general obstetric precautions including but not limited to vaginal bleeding, contractions, leaking of fluid and fetal movement were reviewed in detail with the patient.  I discussed the assessment and treatment plan with the patient. The patient was provided an opportunity to ask questions and all were answered. The patient agreed with the plan and demonstrated an understanding of the instructions. The patient was advised to call back or seek an in-person office evaluation/go to MAU at [facility] for any urgent or concerning symptoms. Please refer to After Visit Summary for other counseling recommendations.   I provided 10 minutes of non-face-to-face time during this encounter. There was 5 minutes of chart review time spent prior to this encounter. Total time spent = 15 minutes.  Return in about 4 weeks (around 05/06/2019) for Return OB w/GBS.  Future Appointments  Date Time Provider Department Center  05/04/2019 11:00 AM [facility] NURSE [facility]  05/04/2019 11:00 AM [facility] [place] 3 [facility]  05/07/2019  9:50 AM Armando Reichert, CNM [facility] None    Raelyn Mora, CNM Center for [employer], [facility]

## 2019-04-25 ENCOUNTER — Encounter (HOSPITAL_COMMUNITY): Payer: Self-pay | Admitting: Family Medicine

## 2019-04-25 ENCOUNTER — Other Ambulatory Visit: Payer: Self-pay

## 2019-04-25 ENCOUNTER — Inpatient Hospital Stay (HOSPITAL_COMMUNITY)
Admission: AD | Admit: 2019-04-25 | Discharge: 2019-04-25 | Disposition: A | Discharge status: Home / Self Care | Payer: Medicaid Other | Attending: Family Medicine | Admitting: Family Medicine

## 2019-04-25 DIAGNOSIS — B3731 Acute candidiasis of vulva and vagina: Secondary | ICD-10-CM

## 2019-04-25 DIAGNOSIS — B373 Candidiasis of vulva and vagina: Secondary | ICD-10-CM | POA: Insufficient documentation

## 2019-04-25 DIAGNOSIS — Z3A34 34 weeks gestation of pregnancy: Secondary | ICD-10-CM | POA: Diagnosis not present

## 2019-04-25 DIAGNOSIS — O4703 False labor before 37 completed weeks of gestation, third trimester: Secondary | ICD-10-CM

## 2019-04-25 DIAGNOSIS — O98813 Other maternal infectious and parasitic diseases complicating pregnancy, third trimester: Secondary | ICD-10-CM | POA: Insufficient documentation

## 2019-04-25 LAB — URINALYSIS, ROUTINE W REFLEX MICROSCOPIC
Bilirubin Urine: NEGATIVE
Glucose, UA: NEGATIVE mg/dL
Hgb urine dipstick: NEGATIVE
Ketones, ur: 20 mg/dL — AB
Nitrite: NEGATIVE
Protein, ur: NEGATIVE mg/dL
Specific Gravity, Urine: 1.018 (ref 1.005–1.030)
pH: 5 (ref 5.0–8.0)

## 2019-04-25 LAB — WET PREP, GENITAL
Clue Cells Wet Prep HPF POC: NONE SEEN
Sperm: NONE SEEN
Trich, Wet Prep: NONE SEEN

## 2019-04-25 MED ORDER — FENTANYL CITRATE (PF) 100 MCG/2ML IJ SOLN
100.0000 ug | INTRAMUSCULAR | Status: DC | PRN
  Administered 2019-04-25: 100 ug via INTRAVENOUS
  Filled 2019-04-25: qty 2

## 2019-04-25 MED ORDER — TERBUTALINE SULFATE 1 MG/ML IJ SOLN
0.2500 mg | Freq: Once | INTRAMUSCULAR | Status: DC
  Filled 2019-04-25: qty 1

## 2019-04-25 MED ORDER — ONDANSETRON HCL 4 MG PO TABS: 4.0000 mg | ORAL_TABLET | Freq: Three times a day (TID) | ORAL | 1 refills | Status: DC | PRN

## 2019-04-25 MED ORDER — PROMETHAZINE HCL 25 MG/ML IJ SOLN
25.0000 mg | Freq: Once | INTRAVENOUS | Status: AC
  Administered 2019-04-25: 25 mg via INTRAVENOUS
  Filled 2019-04-25: qty 1

## 2019-04-25 MED ORDER — LACTATED RINGERS IV BOLUS
1000.0000 mL | Freq: Once | INTRAVENOUS | Status: AC
  Administered 2019-04-25: 1000 mL via INTRAVENOUS

## 2019-04-25 MED ORDER — TERCONAZOLE 0.4 % VA CREA: 1.0000 | TOPICAL_CREAM | Freq: Every day | VAGINAL | 2 refills | Status: DC

## 2019-04-25 MED ORDER — NIFEDIPINE 10 MG PO CAPS: 10.0000 mg | ORAL_CAPSULE | ORAL | Status: DC | PRN

## 2019-04-25 MED ORDER — ONDANSETRON HCL 4 MG/2ML IJ SOLN
4.0000 mg | Freq: Once | INTRAMUSCULAR | Status: AC
  Administered 2019-04-25: 4 mg via INTRAVENOUS
  Filled 2019-04-25: qty 2

## 2019-04-25 NOTE — Discharge Instructions (Signed)
Braxton Hicks Contractions °Contractions of the uterus can occur throughout pregnancy, but they are not always a sign that you are in labor. You may have practice contractions called Braxton Hicks contractions. These false labor contractions are sometimes confused with true labor. °What are Braxton Hicks contractions? °Braxton Hicks contractions are tightening movements that occur in the muscles of the uterus before labor. Unlike true labor contractions, these contractions do not result in opening (dilation) and thinning of the cervix. Toward the end of pregnancy (32-34 weeks), Braxton Hicks contractions can happen more often and may become stronger. These contractions are sometimes difficult to tell apart from true labor because they can be very uncomfortable. You should not feel embarrassed if you go to the hospital with false labor. °Sometimes, the only way to tell if you are in true labor is for your health care provider to look for changes in the cervix. The health care provider will do a physical exam and may monitor your contractions. If you are not in true labor, the exam should show that your cervix is not dilating and your water has not broken. °If there are no other health problems associated with your pregnancy, it is completely safe for you to be sent home with false labor. You may continue to have Braxton Hicks contractions until you go into true labor. °How to tell the difference between true labor and false labor °True labor °· Contractions last 30-70 seconds. °· Contractions become very regular. °· Discomfort is usually felt in the top of the uterus, and it spreads to the lower abdomen and low back. °· Contractions do not go away with walking. °· Contractions usually become more intense and increase in frequency. °· The cervix dilates and gets thinner. °False labor °· Contractions are usually shorter and not as strong as true labor contractions. °· Contractions are usually irregular. °· Contractions  are often felt in the front of the lower abdomen and in the groin. °· Contractions may go away when you walk around or change positions while lying down. °· Contractions get weaker and are shorter-lasting as time goes on. °· The cervix usually does not dilate or become thin. °Follow these instructions at home: ° °· Take over-the-counter and prescription medicines only as told by your health care provider. °· Keep up with your usual exercises and follow other instructions from your health care provider. °· Eat and drink lightly if you think you are going into labor. °· If Braxton Hicks contractions are making you uncomfortable: °? Change your position from lying down or resting to walking, or change from walking to resting. °? Sit and rest in a tub of warm water. °? Drink enough fluid to keep your urine pale yellow. Dehydration may cause these contractions. °? Do slow and deep breathing several times an hour. °· Keep all follow-up prenatal visits as told by your health care provider. This is important. °Contact a health care provider if: °· You have a fever. °· You have continuous pain in your abdomen. °Get help right away if: °· Your contractions become stronger, more regular, and closer together. °· You have fluid leaking or gushing from your vagina. °· You pass blood-tinged mucus (bloody show). °· You have bleeding from your vagina. °· You have low back pain that you never had before. °· You feel your baby’s head pushing down and causing pelvic pressure. °· Your baby is not moving inside you as much as it used to. °Summary °· Contractions that occur before labor are   called Braxton Hicks contractions, false labor, or practice contractions. °· Braxton Hicks contractions are usually shorter, weaker, farther apart, and less regular than true labor contractions. True labor contractions usually become progressively stronger and regular, and they become more frequent. °· Manage discomfort from Braxton Hicks contractions  by changing position, resting in a warm bath, drinking plenty of water, or practicing deep breathing. °This information is not intended to replace advice given to you by your health care provider. Make sure you discuss any questions you have with your health care provider. °Document Revised: 12/20/2016 Document Reviewed: 05/23/2016 °Elsevier Patient Education © 2020 [employer] ° °

## 2019-04-25 NOTE — MAU Provider Note (Signed)
CC:  Chief Complaint  Patient presents with  . Contractions  . Abdominal Pain    pressure     First Provider Initiated Contact with Patient 04/25/19 [id number]      HPI: Brianna Stein is a 29 y.o. year old [id number] female at [redacted]w[redacted]d weeks gestation who presents to MAU reporting irregular contractions minutes since this morning.  Describes pain as all over her stomach.  Associated Sx: Negative for fever, chills, vaginal bleeding, leaking of fluid, urinary complaints, vomiting, diarrhea, constipation.  Positive for nausea. Fetal movement: Normal  O:  Patient Vitals for the past 24 hrs:  BP Temp Temp src Pulse Resp SpO2  04/25/19 1439 (!) 104/58 -- -- (!) 106 16 --  04/25/19 0946 119/60 98.3 F (36.8 C) Oral (!) 107 18 99 %    General: Moderate distress Heart: Mild tachycardia Lungs: Normal rate and effort Abd: Soft, mild, generalized tenderness, but greater on left, mid abdomen, Gravid, S>D Pelvic: NEFG, negative pooling, negative blood.  Large amount of thick, white, curd-like vaginal discharge consistent with  yeast infection.. Dilation: Fingertip Effacement (%): Thick Cervical Position: Posterior Station: Ballotable Presentation: Undeterminable Exam by:: Manya Silvas, CNM  EFM: 140, Moderate variability, 15 x 15 accelerations, no decelerations Toco: Irregular contractions with frequent uterine irritability, mild-moderate  Results for orders placed or performed during the hospital encounter of 04/25/19 (from the past 24 hour(s))  Urinalysis, Routine w reflex microscopic     Status: Abnormal   Collection Time: 04/25/19 12:35 PM  Result Value Ref Range   Color, Urine YELLOW YELLOW   APPearance HAZY (A) CLEAR   Specific Gravity, Urine 1.018 1.005 - 1.030   pH 5.0 5.0 - 8.0   Glucose, UA NEGATIVE NEGATIVE mg/dL   Hgb urine dipstick NEGATIVE NEGATIVE   Bilirubin Urine NEGATIVE NEGATIVE   Ketones, ur 20 (A) NEGATIVE mg/dL   Protein, ur NEGATIVE NEGATIVE mg/dL   Nitrite NEGATIVE  NEGATIVE   Leukocytes,Ua LARGE (A) NEGATIVE   RBC / HPF 6-10 0 - 5 RBC/hpf   WBC, UA 6-10 0 - 5 WBC/hpf   Bacteria, UA RARE (A) NONE SEEN   Squamous Epithelial / LPF 0-5 0 - 5   Mucus PRESENT   Wet prep, genital     Status: Abnormal   Collection Time: 04/25/19 12:35 PM   Specimen: Urethra  Result Value Ref Range   Yeast Wet Prep HPF POC PRESENT (A) NONE SEEN   Trich, Wet Prep NONE SEEN NONE SEEN   Clue Cells Wet Prep HPF POC NONE SEEN NONE SEEN   WBC, Wet Prep HPF POC MANY (A) NONE SEEN   Sperm NONE SEEN      MAU course Orders Placed This Encounter  Procedures  . Wet prep, genital  . Culture, OB Urine  . Urinalysis, Routine w reflex microscopic  . Lab instructions  . Insert peripheral IV   Meds ordered this encounter  Medications  . lactated ringers bolus 1,000 mL  . terbutaline (BRETHINE) injection 0.25 mg  . fentaNYL (SUBLIMAZE) injection 100 mcg  . ondansetron (ZOFRAN) injection 4 mg  . promethazine (PHENERGAN) 25 mg in lactated ringers 1,000 mL infusion  . NIFEdipine (PROCARDIA) capsule 10 mg    No improvement in nausea after Zofran.  Phenergan ordered. Unable to give terbutaline due to persistent, mild tachycardia.  Procardia ordered now that nausea has resolved.  No cervical change.  Patient still states she is very uncomfortable with contractions.  Patient appears to be resting comfortably was CNM/RN come  into the room, but reports only minimal improvement in contraction pain when awakened.  States she is nervous because when she had her last baby she progressed quickly from 3 cm to delivery and is afraid the same thing is going to happen again.  Will observe patient for another hour and recheck cervix.  MDM - Preterm contractions without cervical change over 2.5 hours in MAU.  UA positive leukocytes, otherwise negative. Likely contaminated.  Will send for culture.  - Vaginal yeast infection.  Rx Terazol.  A: [redacted]w[redacted]d week IUP 1. Preterm uterine contractions in  third trimester, antepartum   2. Vaginal yeast infection   FHR reactive  P: Discharge home in stable condition. Preterm labor precautions and fetal kick counts. Follow-up as scheduled for prenatal visit or sooner as needed if symptoms worsen. Return to maternity admissions as needed if symptoms worsen.  Katrinka Blazing, [place], CNM 04/25/2019 10:14 AM  3

## 2019-04-25 NOTE — MAU Note (Signed)
Pt reports to mau with c/o pelvic pressure and irregular ctx since 0700 this morning.  Pt denies lof or vag bleeding.  Reports good fetal movement.

## 2019-04-26 ENCOUNTER — Telehealth: Payer: Self-pay | Admitting: *Deleted

## 2019-04-26 LAB — CULTURE, OB URINE
Culture: <10000 — AB
Special Requests: NORMAL

## 2019-04-26 LAB — GC/CHLAMYDIA PROBE AMP (~~LOC~~) NOT AT ARMC
Chlamydia: NEGATIVE
Comment: NEGATIVE
Comment: NORMAL
Neisseria Gonorrhea: NEGATIVE

## 2019-04-26 NOTE — Telephone Encounter (Signed)
Received call from Hinton Dyer, [facility] HD Case Manager. Patient will case will be closed due to low risk.  Clovis Pu, RN

## 2019-04-27 ENCOUNTER — Other Ambulatory Visit: Payer: Self-pay

## 2019-04-27 ENCOUNTER — Inpatient Hospital Stay (HOSPITAL_COMMUNITY): Payer: Medicaid Other | Admitting: Anesthesiology

## 2019-04-27 ENCOUNTER — Encounter (HOSPITAL_COMMUNITY): Payer: Self-pay | Admitting: Obstetrics & Gynecology

## 2019-04-27 ENCOUNTER — Inpatient Hospital Stay (HOSPITAL_COMMUNITY)
Admission: AD | Admit: 2019-04-27 | Discharge: 2019-04-29 | DRG: 807 | Disposition: A | Discharge status: Home / Self Care | Payer: Medicaid Other | Attending: Family Medicine | Admitting: Family Medicine

## 2019-04-27 DIAGNOSIS — O26893 Other specified pregnancy related conditions, third trimester: Secondary | ICD-10-CM | POA: Diagnosis present

## 2019-04-27 DIAGNOSIS — Z3480 Encounter for supervision of other normal pregnancy, unspecified trimester: Secondary | ICD-10-CM

## 2019-04-27 DIAGNOSIS — O99214 Obesity complicating childbirth: Secondary | ICD-10-CM | POA: Diagnosis present

## 2019-04-27 DIAGNOSIS — Z20822 Contact with and (suspected) exposure to covid-19: Secondary | ICD-10-CM | POA: Diagnosis present

## 2019-04-27 DIAGNOSIS — Z6841 Body Mass Index (BMI) 40.0 and over, adult: Secondary | ICD-10-CM

## 2019-04-27 DIAGNOSIS — D649 Anemia, unspecified: Secondary | ICD-10-CM | POA: Diagnosis present

## 2019-04-27 DIAGNOSIS — O9902 Anemia complicating childbirth: Secondary | ICD-10-CM | POA: Diagnosis present

## 2019-04-27 DIAGNOSIS — O09893 Supervision of other high risk pregnancies, third trimester: Secondary | ICD-10-CM | POA: Diagnosis present

## 2019-04-27 DIAGNOSIS — Z3A35 35 weeks gestation of pregnancy: Secondary | ICD-10-CM | POA: Diagnosis not present

## 2019-04-27 DIAGNOSIS — Z641 Problems related to multiparity: Secondary | ICD-10-CM

## 2019-04-27 LAB — RESPIRATORY PANEL BY RT PCR (FLU A&B, COVID)
Influenza A by PCR: NEGATIVE
Influenza B by PCR: NEGATIVE
SARS Coronavirus 2 by RT PCR: NEGATIVE

## 2019-04-27 LAB — TYPE AND SCREEN
ABO/RH(D): A POS
Antibody Screen: NEGATIVE

## 2019-04-27 LAB — CBC
HCT: 32.8 % — ABNORMAL LOW (ref 36.0–46.0)
Hemoglobin: 10.6 g/dL — ABNORMAL LOW (ref 12.0–15.0)
MCH: 27.4 pg (ref 26.0–34.0)
MCHC: 32.3 g/dL (ref 30.0–36.0)
MCV: 84.8 fL (ref 80.0–100.0)
Platelets: 214 10*3/uL (ref 150–400)
RBC: 3.87 MIL/uL (ref 3.87–5.11)
RDW: 13 % (ref 11.5–15.5)
WBC: 9 10*3/uL (ref 4.0–10.5)
nRBC: 0 % (ref 0.0–0.2)

## 2019-04-27 LAB — ABO/RH: ABO/RH(D): A POS

## 2019-04-27 LAB — RPR: RPR Ser Ql: NONREACTIVE

## 2019-04-27 MED ORDER — DIPHENHYDRAMINE HCL 50 MG/ML IJ SOLN: 12.5000 mg | INTRAMUSCULAR | Status: DC | PRN

## 2019-04-27 MED ORDER — FENTANYL-BUPIVACAINE-NACL 0.5-0.125-0.9 MG/250ML-% EP SOLN
12.0000 mL/h | EPIDURAL | Status: DC | PRN
  Filled 2019-04-27: qty 250

## 2019-04-27 MED ORDER — EPHEDRINE 5 MG/ML INJ: 10.0000 mg | INTRAVENOUS | Status: DC | PRN

## 2019-04-27 MED ORDER — PHENYLEPHRINE 40 MCG/ML (10ML) SYRINGE FOR IV PUSH (FOR BLOOD PRESSURE SUPPORT): 80.0000 ug | PREFILLED_SYRINGE | INTRAVENOUS | Status: DC | PRN

## 2019-04-27 MED ORDER — TERBUTALINE SULFATE 1 MG/ML IJ SOLN: 0.2500 mg | Freq: Once | INTRAMUSCULAR | Status: DC | PRN

## 2019-04-27 MED ORDER — IBUPROFEN 600 MG PO TABS
600.0000 mg | ORAL_TABLET | Freq: Four times a day (QID) | ORAL | Status: DC
  Administered 2019-04-27 – 2019-04-29 (×8): 600 mg via ORAL
  Filled 2019-04-27 (×8): qty 1

## 2019-04-27 MED ORDER — PRENATAL MULTIVITAMIN CH
1.0000 | ORAL_TABLET | Freq: Every day | ORAL | Status: DC
  Administered 2019-04-28 – 2019-04-29 (×2): 1 via ORAL
  Filled 2019-04-27 (×2): qty 1

## 2019-04-27 MED ORDER — LACTATED RINGERS IV SOLN
500.0000 mL | Freq: Once | INTRAVENOUS | Status: AC
  Administered 2019-04-27: 500 mL via INTRAVENOUS

## 2019-04-27 MED ORDER — LACTATED RINGERS IV SOLN: INTRAVENOUS | Status: DC

## 2019-04-27 MED ORDER — OXYTOCIN 40 UNITS IN NORMAL SALINE INFUSION - SIMPLE MED
1.0000 m[IU]/min | INTRAVENOUS | Status: DC
  Administered 2019-04-27: 2 m[IU]/min via INTRAVENOUS
  Filled 2019-04-27: qty 1000

## 2019-04-27 MED ORDER — SIMETHICONE 80 MG PO CHEW: 80.0000 mg | CHEWABLE_TABLET | ORAL | Status: DC | PRN

## 2019-04-27 MED ORDER — SODIUM CHLORIDE (PF) 0.9 % IJ SOLN
INTRAMUSCULAR | Status: DC | PRN
  Administered 2019-04-27: 12 mL/h via EPIDURAL

## 2019-04-27 MED ORDER — OXYCODONE HCL 5 MG PO TABS
10.0000 mg | ORAL_TABLET | ORAL | Status: DC | PRN
  Administered 2019-04-28: 10 mg via ORAL
  Filled 2019-04-27: qty 2

## 2019-04-27 MED ORDER — FENTANYL CITRATE (PF) 100 MCG/2ML IJ SOLN: 50.0000 ug | INTRAMUSCULAR | Status: DC | PRN

## 2019-04-27 MED ORDER — PHENYLEPHRINE 40 MCG/ML (10ML) SYRINGE FOR IV PUSH (FOR BLOOD PRESSURE SUPPORT)
80.0000 ug | PREFILLED_SYRINGE | INTRAVENOUS | Status: AC | PRN
  Administered 2019-04-27 (×3): 80 ug via INTRAVENOUS
  Filled 2019-04-27 (×2): qty 10

## 2019-04-27 MED ORDER — BETAMETHASONE SOD PHOS & ACET 6 (3-3) MG/ML IJ SUSP
12.0000 mg | INTRAMUSCULAR | Status: DC
  Administered 2019-04-27: 12 mg via INTRAMUSCULAR
  Filled 2019-04-27: qty 5

## 2019-04-27 MED ORDER — ACETAMINOPHEN 325 MG PO TABS: 650.0000 mg | ORAL_TABLET | ORAL | Status: DC | PRN

## 2019-04-27 MED ORDER — EPHEDRINE 5 MG/ML INJ
10.0000 mg | INTRAVENOUS | Status: DC | PRN
  Filled 2019-04-27: qty 10

## 2019-04-27 MED ORDER — DIPHENHYDRAMINE HCL 25 MG PO CAPS: 25.0000 mg | ORAL_CAPSULE | Freq: Four times a day (QID) | ORAL | Status: DC | PRN

## 2019-04-27 MED ORDER — PHENYLEPHRINE 40 MCG/ML (10ML) SYRINGE FOR IV PUSH (FOR BLOOD PRESSURE SUPPORT)
80.0000 ug | PREFILLED_SYRINGE | INTRAVENOUS | Status: DC | PRN
  Administered 2019-04-27 (×2): 80 ug via INTRAVENOUS

## 2019-04-27 MED ORDER — LACTATED RINGERS IV SOLN
500.0000 mL | INTRAVENOUS | Status: DC | PRN
  Administered 2019-04-27 (×3): 500 mL via INTRAVENOUS

## 2019-04-27 MED ORDER — LIDOCAINE HCL (PF) 1 % IJ SOLN
30.0000 mL | INTRAMUSCULAR | Status: AC | PRN
  Administered 2019-04-27: 06:00:00 4 mL via SUBCUTANEOUS
  Administered 2019-04-27: 06:00:00 8 mL via SUBCUTANEOUS

## 2019-04-27 MED ORDER — ONDANSETRON HCL 4 MG/2ML IJ SOLN
4.0000 mg | Freq: Four times a day (QID) | INTRAMUSCULAR | Status: DC | PRN
  Administered 2019-04-27: 4 mg via INTRAVENOUS
  Filled 2019-04-27: qty 2

## 2019-04-27 MED ORDER — OXYTOCIN BOLUS FROM INFUSION
500.0000 mL | Freq: Once | INTRAVENOUS | Status: AC
  Administered 2019-04-27: 500 mL via INTRAVENOUS

## 2019-04-27 MED ORDER — SOD CITRATE-CITRIC ACID 500-334 MG/5ML PO SOLN: 30.0000 mL | ORAL | Status: DC | PRN

## 2019-04-27 MED ORDER — DIBUCAINE (PERIANAL) 1 % EX OINT: 1.0000 "application " | TOPICAL_OINTMENT | CUTANEOUS | Status: DC | PRN

## 2019-04-27 MED ORDER — OXYTOCIN 40 UNITS IN NORMAL SALINE INFUSION - SIMPLE MED: 2.5000 [IU]/h | INTRAVENOUS | Status: DC

## 2019-04-27 MED ORDER — PHENYLEPHRINE 40 MCG/ML (10ML) SYRINGE FOR IV PUSH (FOR BLOOD PRESSURE SUPPORT)
80.0000 ug | PREFILLED_SYRINGE | INTRAVENOUS | Status: AC | PRN
  Administered 2019-04-27 (×3): 80 ug via INTRAVENOUS

## 2019-04-27 MED ORDER — SENNOSIDES-DOCUSATE SODIUM 8.6-50 MG PO TABS
2.0000 | ORAL_TABLET | ORAL | Status: DC
  Administered 2019-04-27: 2 via ORAL
  Filled 2019-04-27 (×2): qty 2

## 2019-04-27 MED ORDER — ONDANSETRON HCL 4 MG PO TABS: 4.0000 mg | ORAL_TABLET | ORAL | Status: DC | PRN

## 2019-04-27 MED ORDER — SODIUM CHLORIDE 0.9 % IV SOLN
5.0000 10*6.[IU] | Freq: Once | INTRAVENOUS | Status: AC
  Administered 2019-04-27: 5 10*6.[IU] via INTRAVENOUS
  Filled 2019-04-27: qty 5

## 2019-04-27 MED ORDER — TETANUS-DIPHTH-ACELL PERTUSSIS 5-2.5-18.5 LF-MCG/0.5 IM SUSP: 0.5000 mL | Freq: Once | INTRAMUSCULAR | Status: DC

## 2019-04-27 MED ORDER — BENZOCAINE-MENTHOL 20-0.5 % EX AERO
1.0000 "application " | INHALATION_SPRAY | CUTANEOUS | Status: DC | PRN
  Administered 2019-04-28 – 2019-04-29 (×2): 1 via TOPICAL
  Filled 2019-04-27 (×2): qty 56

## 2019-04-27 MED ORDER — WITCH HAZEL-GLYCERIN EX PADS: 1.0000 "application " | MEDICATED_PAD | CUTANEOUS | Status: DC | PRN

## 2019-04-27 MED ORDER — TRANEXAMIC ACID-NACL 1000-0.7 MG/100ML-% IV SOLN
1000.0000 mg | Freq: Once | INTRAVENOUS | Status: AC
  Administered 2019-04-27: 1000 mg via INTRAVENOUS
  Filled 2019-04-27: qty 100

## 2019-04-27 MED ORDER — ZOLPIDEM TARTRATE 5 MG PO TABS: 5.0000 mg | ORAL_TABLET | Freq: Every evening | ORAL | Status: DC | PRN

## 2019-04-27 MED ORDER — PENICILLIN G POT IN DEXTROSE 60000 UNIT/ML IV SOLN
3.0000 10*6.[IU] | INTRAVENOUS | Status: DC
  Administered 2019-04-27 (×3): 3 10*6.[IU] via INTRAVENOUS
  Filled 2019-04-27 (×3): qty 50

## 2019-04-27 MED ORDER — ACETAMINOPHEN 325 MG PO TABS
650.0000 mg | ORAL_TABLET | ORAL | Status: DC | PRN
  Administered 2019-04-28 (×3): 650 mg via ORAL
  Filled 2019-04-27 (×3): qty 2

## 2019-04-27 MED ORDER — OXYCODONE HCL 5 MG PO TABS
5.0000 mg | ORAL_TABLET | ORAL | Status: DC | PRN
  Administered 2019-04-28 (×2): 5 mg via ORAL
  Filled 2019-04-27 (×2): qty 1

## 2019-04-27 MED ORDER — ONDANSETRON HCL 4 MG/2ML IJ SOLN: 4.0000 mg | INTRAMUSCULAR | Status: DC | PRN

## 2019-04-27 MED ORDER — COCONUT OIL OIL: 1.0000 "application " | TOPICAL_OIL | Status: DC | PRN

## 2019-04-27 NOTE — MAU Note (Signed)
PT SAYS  AT 0130-UC STRONG. PNC WITH RENAISSANCE-  NO VE.   DENIES HSV AND MRSA. GBS- NOT COLLECTED

## 2019-04-27 NOTE — MAU Provider Note (Signed)
History     [id number]  Arrival date and time: 04/27/19 0259    Chief Complaint  Patient presents with  . Abdominal Pain     HPI Brianna Stein is a 29 y.o. at [redacted]w[redacted]d by 6wk [place], who presents for contractions.   Reports ctx started around 0130 Denies loss of fluid or vaginal bleeding Endorses good fetal movement Reports cervix has been checked previously and she was 1cm Reports strong pressure in her bottom   A/Positive/-- (10/16 0923)  OB History    Gravida  7   Para  5   Term  4   Preterm  1   AB  1   Living  5     SAB  1   TAB      Ectopic      Multiple      Live Births  5           Past Medical History:  Diagnosis Date  . Medical history non-contributory     Past Surgical History:  Procedure Laterality Date  . NO PAST SURGERIES      History reviewed. No pertinent family history.  Social History   Socioeconomic History  . Marital status: Married    Spouse name: Miarose Lippert  . Number of children: 5  . Years of education: 53  . Highest education level: High school graduate  Occupational History  . Occupation: Stay Home Mom  Tobacco Use  . Smoking status: Never Smoker  . Smokeless tobacco: Never Used  Substance and Sexual Activity  . Alcohol use: Never  . Drug use: Never  . Sexual activity: Yes    Birth control/protection: None    Comment: Last Depo 2 years ago  Other Topics Concern  . Not on file  Social History Narrative  . Not on file   Social Determinants of Health   Financial Resource Strain: Low Risk   . Difficulty of Paying Living Expenses: Not hard at all  Food Insecurity: No Food Insecurity  . Worried About Charity fundraiser in the Last Year: Never true  . Ran Out of Food in the Last Year: Never true  Transportation Needs: No Transportation Needs  . Lack of Transportation (Medical): No  . Lack of Transportation (Non-Medical): No  Physical Activity: Inactive  . Days of Exercise per Week: 0 days  . Minutes  of Exercise per Session: 0 min  Stress: No Stress Concern Present  . Feeling of Stress : Not at all  Social Connections: Somewhat Isolated  . Frequency of Communication with Friends and Family: Twice a week  . Frequency of Social Gatherings with Friends and Family: Never  . Attends Religious Services: More than 4 times per year  . Active Member of Clubs or Organizations: No  . Attends Archivist Meetings: Never  . Marital Status: Married  Human resources officer Violence: Not At Risk  . Fear of Current or Ex-Partner: No  . Emotionally Abused: No  . Physically Abused: No  . Sexually Abused: No    No Known Allergies  No current facility-administered medications on file prior to encounter.   Current Outpatient Medications on File Prior to Encounter  Medication Sig Dispense Refill  . acetaminophen (TYLENOL) 325 MG tablet Take 650 mg by mouth every 6 (six) hours as needed for mild pain or headache.    . cyclobenzaprine (FLEXERIL) 10 MG tablet Take 1 tablet (10 mg total) by mouth every 8 (eight) hours as needed for muscle spasms.  30 tablet 1  . Prenatal Vit-Fe Phos-FA-Omega (VITAFOL GUMMIES) 3.33-0.333-34.8 MG CHEW Chew 3 each by mouth daily. 90 tablet 4  . Blood Pressure Monitoring (BLOOD PRESSURE MONITOR AUTOMAT) DEVI 1 Device by Does not apply route daily. Automatic blood pressure large cuff. To take blood pressure regularly at home. ICD-10 code: O09.90 1 each 0  . Misc. Devices (GOJJI WEIGHT SCALE) MISC 1 Device by Does not apply route daily as needed. Weight self daily at home as needed. ICD-10 code: O09.90 1 each 0     ROS Pertinent positives and negative per HPI, all others reviewed and negative  Physical Exam   BP 114/70 (BP Location: Right Arm)   Pulse 99   Temp 98.4 F (36.9 C) (Oral)   Resp 20   Ht 5' (1.524 m)   Wt 133.7 kg   LMP 08/05/2018 (Within Weeks)   BMI 57.57 kg/m   Physical Exam  Vitals reviewed. Constitutional: She appears well-developed and  well-nourished. No distress.  Eyes: No scleral icterus.  Respiratory: Effort normal. No respiratory distress.  GI: Soft. She exhibits no distension. There is no abdominal tenderness. There is no rebound and no guarding.  Musculoskeletal:        General: No edema.  Neurological: She is alert. Coordination normal.  Skin: Skin is warm and dry. She is not diaphoretic.  Psychiatric: She has a normal mood and affect.    Cervical Exam Dilation: 4 Effacement (%): Thick Station: Ballotable Exam by:: DR Rand Boller  Bedside Ultrasound Pt informed that the ultrasound is considered a limited OB ultrasound and is not intended to be a complete ultrasound exam.  Patient also informed that the ultrasound is not being completed with the intent of assessing for fetal or placental anomalies or any pelvic abnormalities.  Explained that the purpose of today's ultrasound is to assess for  presentation.  Patient acknowledges the purpose of the exam and the limitations of the study.    Vertex on BSUS  FHT Baseline 135, moderate variability, +accels, -decels Toco : poor tracing Cat I  Labs No results found for this or any previous visit (from the past 24 hour(s)).  Imaging No results found.  MAU Course  Procedures   MDM Moderate  Assessment and Plan  #Preterm labor Patient presenting with cervical dilation and painful contractions. Given P5 and hx of spontaneous preterm birth will admit to L&D for obs, give dose of BMZ, start penicillin. Report called to L&D provider.   #FWB FHT Cat I  Venora Maples

## 2019-04-27 NOTE — Anesthesia Postprocedure Evaluation (Signed)
Anesthesia Post Note  Patient: Brianna Stein  Procedure(s) Performed: AN AD HOC LABOR EPIDURAL     Patient location during evaluation: Mother Baby Anesthesia Type: Epidural Level of consciousness: awake and alert Pain management: pain level controlled Vital Signs Assessment: post-procedure vital signs reviewed and stable Respiratory status: spontaneous breathing, nonlabored ventilation and respiratory function stable Cardiovascular status: stable Postop Assessment: no headache, no backache and epidural receding Anesthetic complications: no    Last Vitals:  Vitals:   04/27/19 1720 04/27/19 1730  BP: (!) 127/57 (!) 113/58  Pulse: 85 88  Resp: 18   Temp:    SpO2:      Last Pain:  Vitals:   04/27/19 1645  TempSrc:   PainSc: 7    Pain Goal:                   Nephi Savage

## 2019-04-27 NOTE — Anesthesia Preprocedure Evaluation (Signed)
Anesthesia Evaluation  Patient identified by MRN, date of birth, ID band Patient awake    Reviewed: Allergy & Precautions, H&P , NPO status , Patient's Chart, lab work & pertinent test results  Airway Mallampati: III  TM Distance: >3 FB Neck ROM: full    Dental no notable dental hx. (+) Teeth Intact   Pulmonary neg pulmonary ROS,    Pulmonary exam normal breath sounds clear to auscultation       Cardiovascular negative cardio ROS Normal cardiovascular exam Rhythm:regular Rate:Normal     Neuro/Psych negative neurological ROS  negative psych ROS   GI/Hepatic negative GI ROS, Neg liver ROS,   Endo/Other  Morbid obesity  Renal/GU negative Renal ROS     Musculoskeletal   Abdominal (+) + obese,   Peds  Hematology  (+) Blood dyscrasia, anemia ,   Anesthesia Other Findings   Reproductive/Obstetrics (+) Pregnancy                             Anesthesia Physical Anesthesia Plan  ASA: III  Anesthesia Plan: Epidural   Post-op Pain Management:    Induction:   PONV Risk Score and Plan:   Airway Management Planned:   Additional Equipment:   Intra-op Plan:   Post-operative Plan:   Informed Consent: I have reviewed the patients History and Physical, chart, labs and discussed the procedure including the risks, benefits and alternatives for the proposed anesthesia with the patient or authorized representative who has indicated his/her understanding and acceptance.       Plan Discussed with:   Anesthesia Plan Comments:         Anesthesia Quick Evaluation

## 2019-04-27 NOTE — Discharge Summary (Addendum)
Postpartum Discharge Summary     Patient Name: Brianna Stein DOB: 09-12-1990 MRN: [id number]  Date of admission: 04/27/2019 Delivering Provider: Merilyn Baba   Date of discharge: 04/30/2019  Admitting diagnosis: Preterm labor [O60.00] Intrauterine pregnancy: [redacted]w[redacted]d    Secondary diagnosis:  Principal Problem:   Preterm labor Active Problems:   BMI 50.0-59.9, adult ([place]   Grand multiparity  Additional problems: GBS unknown     Discharge diagnosis: Preterm Pregnancy Delivered                                                                                                Post partum procedures:None  Augmentation: AROM and Pitocin  Complications: None  Hospital course:  Onset of Labor With Vaginal Delivery     29y.o. yo [id number] admitted in Latent Labor on 04/27/2019. Patient had an uncomplicated labor course as follows:   Admitted with painful contractions and 4 cm dilated. She was given one dose of BMZ. Made some cervical change, and was subsequently augmented with AROM and pitocin. She received adequate GBS ppx with PCN for unknown GBS status. She progressed to complete and delivered shortly thereafter.  Membrane Rupture Time/Date: 12:03 PM ,04/27/2019   Intrapartum Procedures: Episiotomy: None [1]                                         Lacerations:  Periurethral [8]  Patient had a delivery of a Viable infant. 04/27/2019  Information for the patient's newborn:  Brianna, Stein[[id number]]Delivery Method: Vaginal, Spontaneous(Filed from Delivery Summary)     Pateint had an uncomplicated postpartum course.  She is ambulating, tolerating a regular diet, passing flatus, and urinating well. Patient is discharged home in stable condition on 04/30/19.  Delivery time: 5:10 PM    Magnesium Sulfate received: No BMZ received: Yes x1 dose prior to delivery Rhophylac:N/A MMR:N/A Transfusion:No  Physical exam  Vitals:   04/28/19 0500 04/28/19 1550 04/28/19  2218 04/29/19 0500  BP: 128/71 (!) 110/59 (!) 107/55 125/71  Pulse: 79 75 82 74  Resp: _0 Temp: 98.1 F (36.7 C) (!) 97.5 F (36.4 C) 98.7 F (37.1 C) 98.4 F (36.9 C)  TempSrc: Oral Oral Oral Oral  SpO2: 99%  99% 100%  Weight:      Height:       General: alert and no distress  Lochia: appropriate Uterine Fundus: firm Incision: N/A DVT Evaluation: No evidence of DVT seen on physical exam. No cords or calf tenderness. Labs: Lab Results  Component Value Date   WBC 15.9 (H) 04/28/2019   HGB 10.0 (L) 04/28/2019   HCT 30.6 (L) 04/28/2019   MCV 85.2 04/28/2019   PLT 237 04/28/2019   CMP Latest Ref Rng & Units 10/08/2018  Glucose 70 - 99 mg/dL 88  BUN 6 - 20 mg/dL 8  Creatinine 0.44 - 1.00 mg/dL 0.72  Sodium 135 - 145 mmol/L 135  Potassium 3.5 - 5.1 mmol/L 4.1  Chloride 98 - 111 mmol/L 107  CO2 22 - 32 mmol/L 22  Calcium 8.9 - 10.3 mg/dL 8.9  Total Protein 6.5 - 8.1 g/dL 7.2  Total Bilirubin 0.3 - 1.2 mg/dL 0.6  Alkaline Phos 38 - 126 U/L 57  AST 15 - 41 U/L 15  ALT 0 - 44 U/L 18   Edinburgh Score: Edinburgh Postnatal Depression Scale Screening Tool 04/28/2019  I have been able to laugh and see the funny side of things. 0  I have looked forward with enjoyment to things. 0  I have blamed myself unnecessarily when things went wrong. 0  I have been anxious or worried for no good reason. 0  I have felt scared or panicky for no good reason. 0  Things have been getting on top of me. 0  I have been so unhappy that I have had difficulty sleeping. 0  I have felt sad or miserable. 0  I have been so unhappy that I have been crying. 0  The thought of harming myself has occurred to me. 0  Edinburgh Postnatal Depression Scale Total 0    Discharge instruction: per After Visit Summary and "Baby and Me Booklet".  After visit meds:  Allergies as of 04/29/2019   No Known Allergies     Medication List    STOP taking these medications   Blood Pressure Monitor Automat  Devi   Gojji Weight Scale Misc   ondansetron 4 MG tablet Commonly known as: Zofran   Vitafol Gummies 3.33-0.333-34.8 MG Chew     TAKE these medications   acetaminophen 325 MG tablet Commonly known as: TYLENOL Take 650 mg by mouth every 6 (six) hours as needed for mild pain or headache.   cyclobenzaprine 10 MG tablet Commonly known as: FLEXERIL Take 1 tablet (10 mg total) by mouth every 8 (eight) hours as needed for muscle spasms.   terconazole 0.4 % vaginal cream Commonly known as: Terazol 7 Place 1 applicator vaginally at bedtime.       Diet: routine diet  Activity: Advance as tolerated. Pelvic rest for 6 weeks.   Outpatient follow up:4 weeks Follow up Appt: Future Appointments  Date Time Provider [place]  05/12/2019  2:50 PM Lajean Manes, CNM [facility] None  06/09/2019  1:10 PM Laury Deep, CNM [facility] None   Follow up Visit:  Please schedule this patient for Postpartum visit in: 4 weeks with the following provider: Any provider Virtual For C/S patients schedule nurse incision check in weeks 2 weeks: no Low risk pregnancy complicated by: Grand[place]ip[place], Obesity, Pre-term labor Delivery mode:  SVD Anticipated Birth Control:  BTL done PP PP Procedures needed: Incision check in 2 weeks for BTL incision Schedule Integrated BH visit: no  Newborn Data: Live born female  Birth Weight: 2736g APGAR: 49, 10  Newborn Delivery   Birth date/time: 04/27/2019 17:10:00 Delivery type: Vaginal, Spontaneous      Baby Feeding: Bottle Disposition:home with mother   04/30/2019 [name], CNM   CNM attestation I have seen and examined this patient and agree with above documentation in the resident's note.   Brianna Stein is a 29 y.o. [phone] s/p preterm vag del.   Pain is well controlled.  Plan for birth control is interval BTL.  Method of Feeding: bottle  PE:  BP 125/71 (BP Location: Right Arm)   Pulse 74   Temp 98.4 F (36.9 C) (Oral)   Resp 18    Ht 5' (1.524 m)   Wt 133.7  kg   LMP 08/05/2018 (Within Weeks)   SpO2 100%   Breastfeeding Unknown   BMI 57.57 kg/m  Fundus firm  Recent Labs    04/28/19 0643  HGB 10.0*  HCT 30.6*     Plan: discharge today - postpartum care discussed - f/u clinic in 6 weeks for postpartum visit; in the meantime, Dr Ilda Basset has made plans for her to have an interval BTL  [name], CNM 5:23 PM

## 2019-04-27 NOTE — H&P (Signed)
History  [id number]  Arrival date and time: 04/27/19 0259     Chief Complaint  Patient presents with  . Abdominal Pain   HPI  Zalia Hautala is a 29 y.o. at [redacted]w[redacted]d by 6wk [place], who presents for contractions.  Reports ctx started around 0130  Denies loss of fluid or vaginal bleeding  Endorses good fetal movement  Reports cervix has been checked previously and she was 1cm  Reports strong pressure in her bottom  A/Positive/-- (10/16 7035)          OB History     Gravida Para Term Preterm AB Living   7 5 4 1 1 5     SAB TAB Ectopic Multiple Live Births    1    5           Past Medical History:  Diagnosis Date  . Medical history non-contributory         Past Surgical History:  Procedure Laterality Date  . NO PAST SURGERIES     History reviewed. No pertinent family history.  Social History        Socioeconomic History  . Marital status: Married    Spouse name: Camy Leder  . Number of children: 5  . Years of education: 67  . Highest education level: High school graduate  Occupational History  . Occupation: Stay Home Mom  Tobacco Use  . Smoking status: Never Smoker  . Smokeless tobacco: Never Used  Substance and Sexual Activity  . Alcohol use: Never  . Drug use: Never  . Sexual activity: Yes    Birth control/protection: None    Comment: Last Depo 2 years ago  Other Topics Concern  . Not on file  Social History Narrative  . Not on file   Social Determinants of Health      Financial Resource Strain: Low Risk   . Difficulty of Paying Living Expenses: Not hard at all  Food Insecurity: No Food Insecurity  . Worried About Charity fundraiser in the Last Year: Never true  . Ran Out of Food in the Last Year: Never true  Transportation Needs: No Transportation Needs  . Lack of Transportation (Medical): No  . Lack of Transportation (Non-Medical): No  Physical Activity: Inactive  . Days of Exercise per Week: 0 days  . Minutes of Exercise per Session: 0 min   Stress: No Stress Concern Present  . Feeling of Stress : Not at all  Social Connections: Somewhat Isolated  . Frequency of Communication with Friends and Family: Twice a week  . Frequency of Social Gatherings with Friends and Family: Never  . Attends Religious Services: More than 4 times per year  . Active Member of Clubs or Organizations: No  . Attends Archivist Meetings: Never  . Marital Status: Married  Human resources officer Violence: Not At Risk  . Fear of Current or Ex-Partner: No  . Emotionally Abused: No  . Physically Abused: No  . Sexually Abused: No   No Known Allergies  No current facility-administered medications on file prior to encounter.         Current Outpatient Medications on File Prior to Encounter  Medication Sig Dispense Refill  . acetaminophen (TYLENOL) 325 MG tablet Take 650 mg by mouth every 6 (six) hours as needed for mild pain or headache.    . cyclobenzaprine (FLEXERIL) 10 MG tablet Take 1 tablet (10 mg total) by mouth every 8 (eight) hours as needed for muscle spasms. 30 tablet 1  .  Prenatal Vit-Fe Phos-FA-Omega (VITAFOL GUMMIES) 3.33-0.333-34.8 MG CHEW Chew 3 each by mouth daily. 90 tablet 4  . Blood Pressure Monitoring (BLOOD PRESSURE MONITOR AUTOMAT) DEVI 1 Device by Does not apply route daily. Automatic blood pressure large cuff. To take blood pressure regularly at home. ICD-10 code: O09.90 1 each 0  . Misc. Devices (GOJJI WEIGHT SCALE) MISC 1 Device by Does not apply route daily as needed. Weight self daily at home as needed. ICD-10 code: O09.90 1 each 0   ROS  Pertinent positives and negative per HPI, all others reviewed and negative  Physical Exam  BP 114/70 (BP Location: Right Arm)  Pulse 99  Temp 98.4 F (36.9 C) (Oral)  Resp 20  Ht 5' (1.524 m)  Wt 133.7 kg  LMP 08/05/2018 (Within Weeks)  BMI 57.57 kg/m  Physical Exam  Vitals reviewed.  Constitutional: She appears well-developed and well-nourished. No distress.  Eyes: No scleral  icterus.  Respiratory: Effort normal. No respiratory distress.  GI: Soft. She exhibits no distension. There is no abdominal tenderness. There is no rebound and no guarding.  Musculoskeletal:  General: No edema.  Neurological: She is alert. Coordination normal.  Skin: Skin is warm and dry. She is not diaphoretic.  Psychiatric: She has a normal mood and affect.   Cervical Exam  Dilation: 4  Effacement (%): Thick  Station: Ballotable  Exam by:: DR ECKSTAT  Bedside Ultrasound  Pt informed that the ultrasound is considered a limited OB ultrasound and is not intended to be a complete ultrasound exam. Patient also informed that the ultrasound is not being completed with the intent of assessing for fetal or placental anomalies or any pelvic abnormalities. Explained that the purpose of today's ultrasound is to assess for presentation. Patient acknowledges the purpose of the exam and the limitations of the study.  Vertex on BSUS  FHT  Baseline 135, moderate variability, +accels, -decels  Toco : poor tracing  Cat I  Labs  Lab Results Last 24 Hours    Imaging  No results found.  MAU Course  Procedures  MDM  Moderate  Assessment and Plan  #Preterm labor  Patient presenting with cervical dilation and painful contractions. Given P5 and hx of spontaneous preterm birth will admit to L&D for obs, give dose of BMZ, start penicillin. Report called to L&D provider.  #FWB  FHT Cat I  Venora Maples  Discussed w/Dr. Crissie Reese and agree w/POC

## 2019-04-27 NOTE — Progress Notes (Signed)
Chivon Lepage is a 29 y.o. [id number] at [redacted]w[redacted]d admitted for preterm labor  Subjective: Called to room by RN for low fetal heart rate baseline change. No LOF.  Objective: BP 110/63   Pulse 82   Temp 97.6 F (36.4 C) (Oral)   Resp 18   Ht 5' (1.524 m)   Wt 133.7 kg   LMP 08/05/2018 (Within Weeks)   SpO2 100%   BMI 57.57 kg/m  Total I/O In: 499.2 [I.V.:499.2] Out: 200 [Urine:200]  FHT:  FHR: 105-110 bpm, variability: moderate,  accelerations:  Present,  decelerations:  Absent UC:   regular, every 3-4 minutes  SVE:   Dilation: 5 Effacement (%): 50 Station: -2 Exam by:: Dr. Salomon Mast  Labs: Lab Results  Component Value Date   WBC 9.0 04/27/2019   HGB 10.6 (L) 04/27/2019   HCT 32.8 (L) 04/27/2019   MCV 84.8 04/27/2019   PLT 214 04/27/2019    Assessment / Plan: 29 yo [id number] at 35.0 EGA admitted for preterm labor  Labor: Making cervical change, AROM with clear fluid. Start Pitocin for augmentation. Fetal Wellbeing:  FSE placed due to low baseline, difficulty tracing baby. Category I/II, s/p BMZx1 Pain Control:  Epidural I/D:  GBS unknown, PCN for PTL Anticipated MOD:  NSVD  Dynastee Brummell L Chinwe Lope DO OB Fellow, Faculty Practice 04/27/2019, 12:11 PM

## 2019-04-27 NOTE — Anesthesia Procedure Notes (Signed)
Epidural Patient location during procedure: OB Start time: 04/27/2019 5:36 AM End time: 04/27/2019 5:40 AM  Staffing Anesthesiologist: Leilani Able, MD Performed: anesthesiologist   Preanesthetic Checklist Completed: patient identified, IV checked, site marked, risks and benefits discussed, surgical consent, monitors and equipment checked, pre-op evaluation and timeout performed  Epidural Patient position: sitting Prep: DuraPrep and site prepped and draped Patient monitoring: continuous pulse ox and blood pressure Approach: midline Location: L3-L4 Injection technique: LOR air  Needle:  Needle type: Tuohy  Needle gauge: 17 G Needle length: 9 cm and 9 Needle insertion depth: 8 cm Catheter type: closed end flexible Catheter size: 19 Gauge Catheter at skin depth: 15 cm Test dose: negative and Other  Assessment Events: blood not aspirated, injection not painful, no injection resistance, no paresthesia and negative IV test  Additional Notes Reason for block:procedure for pain

## 2019-04-27 NOTE — Progress Notes (Signed)
Brianna Stein is a 29 y.o. [id number] at [redacted]w[redacted]d admitted for preterm labor  Subjective: More uncomfortable with contractions, rating them 8/10. Feeling baby move.  Objective: BP 110/Brianna   Pulse (!) 108   Temp 97.9 F (36.6 C) (Oral)   Resp 18   Ht 5' (1.524 m)   Wt 133.7 kg   LMP 08/05/2018 (Within Weeks)   SpO2 100%   BMI 57.57 kg/m  Total I/O In: 499.2 [I.V.:499.2] Out: 200 [Urine:200]  FHT:  FHR: 125 bpm, variability: moderate,  accelerations:  Present,  decelerations:  Absent UC:   regular, every 3 minutes  SVE:   Dilation: 4 Effacement (%): 50 Station: -1 Exam by:: Dr Salomon Mast  Labs: Lab Results  Component Value Date   WBC 9.0 04/27/2019   HGB 10.6 (L) 04/27/2019   HCT 32.8 (L) 04/27/2019   MCV 84.8 04/27/2019   PLT 214 04/27/2019    Assessment / Plan: Brianna Stein at 35.0 EGA admitted for preterm labor  Labor: Making cervical change, will augment with pitocin Fetal Wellbeing:  Category I, s/p BMZx1 Pain Control:  Epidural I/D:  GBS unknown, PCN for PTL Anticipated MOD:  NSVD  Merlon Alcorta L Tyjai Charbonnet DO OB Fellow, Faculty Practice 04/27/2019, 9:17 AM

## 2019-04-27 NOTE — Progress Notes (Signed)
Brianna Stein is a 29 y.o. [id number] at [redacted]w[redacted]d admitted for pre-term labor  Subjective: Comfortable with epidural, feeling more pressure in her bottom.  Objective: BP 114/76   Pulse 92   Temp (!) 97.5 F (36.4 C) (Oral)   Resp 18   Ht 5' (1.524 m)   Wt 133.7 kg   LMP 08/05/2018 (Within Weeks)   SpO2 100%   BMI 57.57 kg/m  Total I/O In: 2040.8 [P.O.:120; I.V.:1920.8] Out: 775 [Urine:775]  FHT:  FHR: 110-115 bpm, variability: moderate,  accelerations:  Present,  decelerations:  Present occasional variable UC:   irregular, every 3-7 minutes  SVE:   Dilation: 5 Effacement (%): 70 Station: -1 Exam by:: Dr. Salomon Mast  Pitocin @ 12 mu/min  Labs: Lab Results  Component Value Date   WBC 9.0 04/27/2019   HGB 10.6 (L) 04/27/2019   HCT 32.8 (L) 04/27/2019   MCV 84.8 04/27/2019   PLT 214 04/27/2019    Assessment / Plan: 29 yo [id number] at 35.0 EGA admitted for preterm labor  Labor:On pitocin. S/p AROM. IUPC placed at this time Fetal Wellbeing:Cat I, FSE in place, s/p BMZx1 Pain Control:Epidural I/D:GBS unknown, PCN for PTL Anticipated FMB:WGYK  [name] DO OB Fellow, Faculty Practice 04/27/2019, 3:52 PM

## 2019-04-28 ENCOUNTER — Encounter: Payer: Self-pay | Admitting: Obstetrics and Gynecology

## 2019-04-28 ENCOUNTER — Encounter (HOSPITAL_COMMUNITY)
Admission: AD | Disposition: A | Discharge status: Home / Self Care | Payer: Self-pay | Source: Home / Self Care | Attending: Family Medicine

## 2019-04-28 ENCOUNTER — Encounter (HOSPITAL_COMMUNITY): Payer: Self-pay | Admitting: Anesthesiology

## 2019-04-28 DIAGNOSIS — Z6841 Body Mass Index (BMI) 40.0 and over, adult: Secondary | ICD-10-CM

## 2019-04-28 HISTORY — DX: Body Mass Index (BMI) 40.0 and over, adult: Z684

## 2019-04-28 LAB — CBC
HCT: 30.6 % — ABNORMAL LOW (ref 36.0–46.0)
Hemoglobin: 10 g/dL — ABNORMAL LOW (ref 12.0–15.0)
MCH: 27.9 pg (ref 26.0–34.0)
MCHC: 32.7 g/dL (ref 30.0–36.0)
MCV: 85.2 fL (ref 80.0–100.0)
Platelets: 237 10*3/uL (ref 150–400)
RBC: 3.59 MIL/uL — ABNORMAL LOW (ref 3.87–5.11)
RDW: 13.1 % (ref 11.5–15.5)
WBC: 15.9 10*3/uL — ABNORMAL HIGH (ref 4.0–10.5)
nRBC: 0 % (ref 0.0–0.2)

## 2019-04-28 SURGERY — LIGATION, FALLOPIAN TUBE, POSTPARTUM
Anesthesia: Regional | Laterality: Bilateral

## 2019-04-28 NOTE — Progress Notes (Signed)
I and Dr. Vergie Living evaluated patient for tubal ligation. Given patient's morbid obesity and associated increased risks with laparotomy, shared decision with patient to defer surgery today and instead plan on interval laparoscopic BTL with Dr. Vergie Living. Pt undecided for now whether she wants interim contraception.

## 2019-04-28 NOTE — Progress Notes (Addendum)
Post Partum Day 1   Subjective: no complaints, up ad lib, voiding, tolerating PO and + flatus  Objective: Blood pressure 128/71, pulse 79, temperature 98.1 F (36.7 C), temperature source Oral, resp. rate 18, height 5' (1.524 m), weight 133.7 kg, last menstrual period 08/05/2018, SpO2 99 %, unknown if currently breastfeeding.  Physical Exam:  General: alert and no distress Lochia: appropriate Uterine Fundus: firm Incision: NA DVT Evaluation: No evidence of DVT seen on physical exam. Negative Homan's sign. No cords or calf tenderness.  Recent Labs    04/27/19 0439 04/28/19 0643  HGB 10.6* 10.0*  HCT 32.8* 30.6*    Assessment/Plan: Planning to breast and bottle feeding. BTL scheduled for later today.      LOS: 1 day   Lyndee Hensen , DO  04/28/2019, 8:20 AM   I saw and evaluated the patient. I agree with the findings and the plan of care as documented in the resident's note. Dr. Si Raider met with patient and decided BTL better as interval due to morbid obesity; message sent to schedule by Dr. Si Raider. Plan for discharge tomorrow. Patient declines any birth control in the interim.   Barrington Ellison, MD [facility] Family Medicine Fellow, [facility] for [employer], [place]

## 2019-04-29 ENCOUNTER — Encounter (HOSPITAL_COMMUNITY): Payer: Self-pay | Admitting: Obstetrics & Gynecology

## 2019-04-29 LAB — SURGICAL PATHOLOGY

## 2019-04-29 LAB — CULTURE, BETA STREP (GROUP B ONLY)

## 2019-04-29 NOTE — Progress Notes (Signed)
OB Note Patient seen and will set up for BTL. All questions asked and answered Late entry  Cornelia Copa MD Attending Center for [employer] [facility])

## 2019-04-30 DIAGNOSIS — Z641 Problems related to multiparity: Secondary | ICD-10-CM

## 2019-05-04 ENCOUNTER — Ambulatory Visit (HOSPITAL_COMMUNITY): Payer: Medicaid Other

## 2019-05-04 ENCOUNTER — Encounter (HOSPITAL_COMMUNITY): Payer: Self-pay

## 2019-05-05 ENCOUNTER — Encounter: Payer: Medicaid Other | Admitting: Student

## 2019-05-07 ENCOUNTER — Encounter: Payer: Medicaid Other | Admitting: Advanced Practice Midwife

## 2019-05-12 ENCOUNTER — Ambulatory Visit: Payer: Medicaid Other | Admitting: Certified Nurse Midwife

## 2019-05-13 ENCOUNTER — Other Ambulatory Visit: Payer: Self-pay | Admitting: Obstetrics and Gynecology

## 2019-06-07 NOTE — Progress Notes (Signed)
Your procedure is scheduled on Tuesday May 25.  Report to [facility] Main Entrance "A" at 11:15 A.M., and check in at the Admitting office.  Call this number if you have problems the morning of surgery: [phone]  Call [phone] if you have any questions prior to your surgery date Monday-Friday 8am-4pm   Remember: Do not eat after midnight the night before your surgery  You may drink clear liquids until 10:15 A.M the morning of your surgery.   Clear liquids allowed are: Water, Non-Citrus Juices (without pulp), Carbonated Beverages, Clear Tea, Black Coffee Only, and Gatorade   Take these medicines the morning of surgery with A SIP OF WATER: None needed  If needed: Tylenol  7 days prior to surgery STOP taking any Aspirin (unless otherwise instructed by your surgeon), Aleve, Naproxen, Ibuprofen, Motrin, Advil, Goody's, BC's, all herbal medications, fish oil, and all vitamins.    The Morning of Surgery  Do not wear jewelry, make-up or nail polish.  Do not wear lotions, powders, or perfumes, or deodorant  Do not shave 48 hours prior to surgery.   Do not bring valuables to the hospital.  [facility] is not responsible for any belongings or valuables.  If you are a smoker, DO NOT Smoke 24 hours prior to surgery  If you wear a CPAP at night please bring your mask the morning of surgery   Remember that you must have someone to transport you home after your surgery, and remain with you for 24 hours if you are discharged the same day.   Please bring cases for contacts, glasses, hearing aids, dentures or bridgework because it cannot be worn into surgery.    Leave your suitcase in the car.  After surgery it may be brought to your room.  For patients admitted to the hospital, discharge time will be determined by your treatment team.  Patients discharged the day of surgery will not be allowed to drive home.    Special instructions:   [place]- Preparing For  Surgery  Before surgery, you can play an important role. Because skin is not sterile, your skin needs to be as free of germs as possible. You can reduce the number of germs on your skin by washing with CHG (chlorahexidine gluconate) Soap before surgery.  CHG is an antiseptic cleaner which kills germs and bonds with the skin to continue killing germs even after washing.    Oral Hygiene is also important to reduce your risk of infection.  Remember - BRUSH YOUR TEETH THE MORNING OF SURGERY WITH YOUR REGULAR TOOTHPASTE  Please do not use if you have an allergy to CHG or antibacterial soaps. If your skin becomes reddened/irritated stop using the CHG.  Do not shave (including legs and underarms) for at least 48 hours prior to first CHG shower. It is OK to shave your face.  Please follow these instructions carefully.   1. Shower the NIGHT BEFORE SURGERY and the MORNING OF SURGERY with CHG Soap.   2. If you chose to wash your hair and body, wash as usual with your normal shampoo and body-wash/soap.  3. Rinse your hair and body thoroughly to remove the shampoo and soap.  4. Apply CHG directly to the skin (ONLY FROM THE NECK DOWN) and wash gently with a scrungie or a clean washcloth.   5. Do not use on open wounds or open sores. Avoid contact with your eyes, ears, mouth and genitals (private parts). Wash Face and genitals (private parts)  with your normal soap.   6. Wash thoroughly, paying special attention to the area where your surgery will be performed.  7. Thoroughly rinse your body with warm water from the neck down.  8. DO NOT shower/wash with your normal soap after using and rinsing off the CHG Soap.  9. Pat yourself dry with a CLEAN TOWEL.  10. Wear CLEAN PAJAMAS to bed the night before surgery  11. Place CLEAN SHEETS on your bed the night of your first shower and DO NOT SLEEP WITH PETS.  12. Wear comfortable clothes the morning of surgery.     Day of Surgery:  Please shower the  morning of surgery with the CHG soap Do not apply any deodorants/lotions. Please wear clean clothes to the hospital/surgery center.   Remember to brush your teeth WITH YOUR REGULAR TOOTHPASTE.   Please read over the following fact sheets that you were given.

## 2019-06-08 ENCOUNTER — Inpatient Hospital Stay (HOSPITAL_COMMUNITY)
Admission: RE | Admit: 2019-06-08 | Discharge: 2019-06-08 | Disposition: A | Discharge status: Home / Self Care | Payer: Medicaid Other | Source: Ambulatory Visit

## 2019-06-08 NOTE — Progress Notes (Signed)
Patient did not show for PAT appointment. Called patient, who said she will not be able to make it. Patient given option to do same-day work-up phone call. Patient stated she just had a death in her family and is on the road. Patient requested the phone call be done anytime on Friday 05/21/ 12. Patient is not scheduled for COVID test. Patient stated anytime Monday would work for her COVID test to be done.

## 2019-06-09 ENCOUNTER — Encounter: Payer: Self-pay | Admitting: Obstetrics and Gynecology

## 2019-06-09 ENCOUNTER — Telehealth (INDEPENDENT_AMBULATORY_CARE_PROVIDER_SITE_OTHER): Payer: Medicaid Other | Admitting: Obstetrics and Gynecology

## 2019-06-09 NOTE — Progress Notes (Signed)
I connected with@ on 06/15/19 at  1:10 PM EDT by: Mychart video and verified that I am speaking with the correct person using two identifiers.  Patient is located at out of town and provider is located at [employer] for [employer] at [employer] .     The purpose of this virtual visit is to provide medical care while limiting exposure to the novel coronavirus. I discussed the limitations, risks, security and privacy concerns of performing an evaluation and management service by Mychart and the availability of in person appointments. I also discussed with the patient that there may be a patient responsible charge related to this service. By engaging in this virtual visit, you consent to the provision of healthcare.  Additionally, you authorize for your insurance to be billed for the services provided during this visit.  The patient expressed understanding and agreed to proceed.  The following staff members participated in the virtual visit:  Raelyn Mora, CNM   Post Partum Visit Note Subjective:   Brianna Stein is a 29 y.o. [phone] female being evaluated for postpartum followup.  She is 6 weeks postpartum following a normal spontaneous vaginal delivery at  35 gestational weeks.  I have fully reviewed the prenatal and intrapartum course; pregnancy complicated by chronic back pain.  Postpartum course has been uncomplicated. Baby is doing well and gaining weight appropriately. Baby is feeding by bottle - Similac Neosure. Bleeding irregular bleeding. Bowel function is normal. Bladder function is normal. Patient is not sexually active. Contraception method is undecided. Postpartum depression screening: negative.  The following portions of the patient's history were reviewed and updated as appropriate: allergies, current medications, past family history, past medical history, past social history, past surgical history and problem list.  Review of Systems Constitutional: negative Eyes:  negative Ears, nose, mouth, throat, and face: negative Respiratory: negative Cardiovascular: negative Gastrointestinal: negative Genitourinary:negative Integument/breast: negative Hematologic/lymphatic: negative Musculoskeletal:negative Neurological: negative Behavioral/Psych: negative Endocrine: negative Allergic/Immunologic: negative   Objective:  There were no vitals filed for this visit.  Patient is out of town at time of her visit due to death in the family.  She reports that she will take her blood pressure and send it via My chart when she gets back      Assessment:  Normal postpartum exam.  Plan:  Essential components of care per ACOG recommendations:  1.  Mood and well being: Patient with negative depression screening today. Reviewed local resources for support.  - Patient does not use tobacco. If using tobacco we discussed reduction and for recently cessation risk of relapse - hx of drug use? No   If yes, discussed support systems  2. Infant care and feeding:  -Patient currently breastmilk feeding? No If breastmilk feeding discussed return to work and pumping. If needed, patient was provided letter for work to allow for every 2-3 hr pumping breaks, and to be granted a private location to express breastmilk and refrigerated area to store breastmilk. Reviewed importance of draining breast regularly to support lactation. -Social determinants of health (SDOH) reviewed in EPIC. No concerns  3. Sexuality, contraception and birth spacing - Patient does not want a pregnancy in the next year.  Desired family size is 5 children.  - Reviewed forms of contraception in tiered fashion. Patient desired IUD today.   - Discussed birth spacing of 18 months - Reports that she no longer desires BTL since "MDs said could not have BTL d/t her BMI."  4. Sleep and fatigue -Encouraged  family/partner/community support of 4 hrs of uninterrupted sleep to help with mood and fatigue  5. Physical  Recovery  - Discussed patient's delivery and complications - Patient had a periurethral abrasion; laceration reviewed. Patient expressed understanding - Patient has urinary incontinence? No Patient was referred to pelvic floor PT  - Patient is safe to resume physical and sexual activity  6.  Health Maintenance - Last pap smear done 11/06/2018 and was normal with negative HPV.   7. No Chronic Disease - PCP follow up  10 minutes of non-face-to-face time spent with the patient There was 5 minutes of chart review time spent prior to this encounter. Total time spent = 15 minutes.    Laury Deep, [place] for [place]

## 2019-06-15 ENCOUNTER — Ambulatory Visit (HOSPITAL_COMMUNITY)
Admission: RE | Admit: 2019-06-15 | Payer: Medicaid Other | Source: Home / Self Care | Admitting: Obstetrics and Gynecology

## 2019-06-15 ENCOUNTER — Encounter (HOSPITAL_COMMUNITY): Admission: RE | Payer: Self-pay | Source: Home / Self Care

## 2019-06-15 SURGERY — LIGATION, FALLOPIAN TUBE, LAPAROSCOPIC
Anesthesia: Choice | Laterality: Bilateral

## 2019-06-18 ENCOUNTER — Other Ambulatory Visit: Payer: Self-pay

## 2019-06-18 ENCOUNTER — Ambulatory Visit (INDEPENDENT_AMBULATORY_CARE_PROVIDER_SITE_OTHER): Payer: Medicaid Other | Admitting: Student

## 2019-06-18 ENCOUNTER — Encounter: Payer: Self-pay | Admitting: General Practice

## 2019-06-18 ENCOUNTER — Encounter: Payer: Self-pay | Admitting: Student

## 2019-06-18 VITALS — BP 99/58 | HR 88 | Temp 98.0°F | Wt 284.6 lb

## 2019-06-18 DIAGNOSIS — Z3202 Encounter for pregnancy test, result negative: Secondary | ICD-10-CM

## 2019-06-18 DIAGNOSIS — Z30430 Encounter for insertion of intrauterine contraceptive device: Secondary | ICD-10-CM | POA: Diagnosis not present

## 2019-06-18 LAB — POCT URINE PREGNANCY: Preg Test, Ur: NEGATIVE

## 2019-06-18 MED ORDER — PARAGARD INTRAUTERINE COPPER IU IUD: INTRAUTERINE_SYSTEM | Freq: Once | INTRAUTERINE | Status: AC

## 2019-06-18 NOTE — Progress Notes (Signed)
   GYNECOLOGY CLINIC PROCEDURE NOTE  Ashlynn Gunnels is a 29 y.o. C3W1720 here for paragard IUD insertion. No GYN concerns.  Last pap smear was on 2020 and was normal.  IUD Insertion Procedure Note Patient identified, informed consent performed, consent signed.   Discussed risks of irregular bleeding, cramping, infection, malpositioning or misplacement of the IUD outside the uterus which may require further procedure such as laparoscopy. Time out was performed.  Urine pregnancy test negative.  Speculum placed in the vagina.  Cervix visualized.  Cleaned with Betadine x 2.  Grasped anteriorly with a single tooth tenaculum.  Uterus sounded to 7 cm.  Paragard IUD placed per manufacturer's recommendations.  Strings trimmed to 3 cm. Tenaculum was removed, good hemostasis noted.  Patient tolerated procedure well.   Patient was given post-procedure instructions.  She was advised to have backup contraception for one week.  Patient was also asked to check IUD strings periodically and follow up in 4 weeks for IUD check.   Judeth Horn, NP

## 2019-06-18 NOTE — Patient Instructions (Signed)

## 2019-07-01 ENCOUNTER — Ambulatory Visit: Payer: Medicaid Other | Admitting: Obstetrics and Gynecology

## 2019-07-07 ENCOUNTER — Ambulatory Visit: Payer: Medicaid Other | Admitting: Student

## 2019-07-21 ENCOUNTER — Ambulatory Visit: Payer: Medicaid Other | Admitting: Student

## 2019-10-01 ENCOUNTER — Ambulatory Visit (HOSPITAL_COMMUNITY)
Admission: EM | Admit: 2019-10-01 | Discharge: 2019-10-01 | Disposition: A | Discharge status: Home / Self Care | Payer: Medicaid Other | Attending: Emergency Medicine | Admitting: Emergency Medicine

## 2019-10-01 ENCOUNTER — Encounter (HOSPITAL_COMMUNITY): Payer: Self-pay

## 2019-10-01 ENCOUNTER — Other Ambulatory Visit: Payer: Self-pay

## 2019-10-01 DIAGNOSIS — K0889 Other specified disorders of teeth and supporting structures: Secondary | ICD-10-CM | POA: Diagnosis not present

## 2019-10-01 MED ORDER — PENICILLIN V POTASSIUM 500 MG PO TABS: 500.0000 mg | ORAL_TABLET | Freq: Four times a day (QID) | ORAL | 0 refills | Status: AC

## 2019-10-01 MED ORDER — CHLORHEXIDINE GLUCONATE 0.12 % MT SOLN: 15.0000 mL | Freq: Two times a day (BID) | OROMUCOSAL | 0 refills | Status: DC

## 2019-10-01 MED ORDER — IBUPROFEN 800 MG PO TABS
800.0000 mg | ORAL_TABLET | Freq: Once | ORAL | Status: AC
  Administered 2019-10-01: 800 mg via ORAL

## 2019-10-01 MED ORDER — IBUPROFEN 800 MG PO TABS: 800.0000 mg | ORAL_TABLET | Freq: Three times a day (TID) | ORAL | 0 refills | Status: DC

## 2019-10-01 MED ORDER — IBUPROFEN 800 MG PO TABS
ORAL_TABLET | ORAL | Status: AC
  Filled 2019-10-01: qty 1

## 2019-10-01 NOTE — Discharge Instructions (Signed)
Ibuprofen every 8 hours as needed for pain, may alternate with tylenol as needed for pain.  Complete course of antibiotics.  Please follow up with a dentist for definitive treatment.

## 2019-10-01 NOTE — ED Provider Notes (Signed)
[facility]    CSN: [id number] Arrival date & time: 10/01/19  6767      History   Chief Complaint Chief Complaint  Patient presents with  . Dental Pain    HPI Brianna Stein is a 29 y.o. female.   Brianna Stein presents with complaints of Left facial and dental swelling and pain which started 1 week ago. No known injury/ issues to teeth. No fever. Doesn't follow with a dentist. Taking tylenol which hasn't helped.     ROS per HPI, negative if not otherwise mentioned.      Past Medical History:  Diagnosis Date  . BMI 50.0-59.9, adult (HCC) 04/28/2019    Patient Active Problem List   Diagnosis Date Noted  . Grand multiparity 04/30/2019  . BMI 50.0-59.9, adult (HCC) 04/28/2019  . Preterm labor 04/27/2019  . Supervision of other normal pregnancy, antepartum 10/12/2018    Past Surgical History:  Procedure Laterality Date  . NO PAST SURGERIES      OB History    Gravida  7   Para  6   Term  4   Preterm  2   AB  1   Living  6     SAB  1   TAB      Ectopic      Multiple  0   Live Births  6            Home Medications    Prior to Admission medications   Medication Sig Start Date End Date Taking? Authorizing Provider  acetaminophen (TYLENOL) 325 MG tablet Take 650 mg by mouth every 6 (six) hours as needed for mild pain or headache.    [provider]  cyclobenzaprine (FLEXERIL) 10 MG tablet Take 1 tablet (10 mg total) by mouth every 8 (eight) hours as needed for muscle spasms. Patient not taking: Reported on 05/26/2019 03/25/19   Raelyn Mora, CNM  ibuprofen (ADVIL) 800 MG tablet Take 1 tablet (800 mg total) by mouth 3 (three) times daily. 10/01/19   Georgetta Haber, NP  penicillin v potassium (VEETID) 500 MG tablet Take 1 tablet (500 mg total) by mouth 4 (four) times daily for 7 days. 10/01/19 10/08/19  Georgetta Haber, NP  terconazole (TERAZOL 7) 0.4 % vaginal cream Place 1 applicator vaginally at bedtime. Patient not taking:  Reported on 05/26/2019 04/25/19   Dorathy Kinsman, CNM    Family History History reviewed. No pertinent family history.  Social History Social History   Tobacco Use  . Smoking status: Never Smoker  . Smokeless tobacco: Never Used  Vaping Use  . Vaping Use: Never used  Substance Use Topics  . Alcohol use: Never  . Drug use: Never     Allergies   Patient has no known allergies.   Review of Systems Review of Systems   Physical Exam Triage Vital Signs ED Triage Vitals  Enc Vitals Group     BP 10/01/19 0933 125/84     Pulse Rate 10/01/19 0933 67     Resp 10/01/19 0933 17     Temp 10/01/19 0933 98.2 F (36.8 C)     Temp Source 10/01/19 0933 Oral     SpO2 10/01/19 0933 100 %     Weight --      Height --      Head Circumference --      Peak Flow --      Pain Score 10/01/19 0931 8     Pain Loc --  Pain Edu? --      Excl. in GC? --    No data found.  Updated Vital Signs BP 125/84 (BP Location: Left Wrist)   Pulse 67   Temp 98.2 F (36.8 C) (Oral)   Resp 17   LMP 09/04/2019 (Approximate)   SpO2 100%   Breastfeeding No   Visual Acuity Right Eye Distance:   Left Eye Distance:   Bilateral Distance:    Right Eye Near:   Left Eye Near:    Bilateral Near:     Physical Exam Constitutional:      General: She is not in acute distress.    Appearance: She is well-developed.  HENT:     Head:     Comments: Left jaw tenderness, lower and upper, with mild swelling noted; lower molars are tender to touch; upper gum line with tenderness and noted redness as well as white plaquing to gums without visible abscess presence; no soft tissue abscess or tenderness noted  Cardiovascular:     Rate and Rhythm: Normal rate.  Pulmonary:     Effort: Pulmonary effort is normal.  Skin:    General: Skin is warm and dry.  Neurological:     Mental Status: She is alert and oriented to person, place, and time.      UC Treatments / Results  Labs (all labs ordered are listed,  but only abnormal results are displayed) Labs Reviewed - No data to display  EKG   Radiology No results found.  Procedures Procedures (including critical care time)  Medications Ordered in UC Medications  ibuprofen (ADVIL) tablet 800 mg (800 mg Oral Given 10/01/19 1004)    Initial Impression / Assessment and Plan / UC Course  I have reviewed the triage vital signs and the nursing notes.  Pertinent labs & imaging results that were available during my care of the patient were reviewed by me and considered in my medical decision making (see chart for details).     Concern for dental infection although difficult to determine specific affected tooth due to significance of pain. Antibiotics and oral mouth rinse provided. Dental follow up emphasized. Patient verbalized understanding and agreeable to plan.   Final Clinical Impressions(s) / UC Diagnoses   Final diagnoses:  Pain, dental     Discharge Instructions     Ibuprofen every 8 hours as needed for pain, may alternate with tylenol as needed for pain.  Complete course of antibiotics.  Please follow up with a dentist for definitive treatment.    ED Prescriptions    Medication Sig Dispense Auth. Provider   penicillin v potassium (VEETID) 500 MG tablet Take 1 tablet (500 mg total) by mouth 4 (four) times daily for 7 days. 28 tablet Linus Mako B, NP   ibuprofen (ADVIL) 800 MG tablet Take 1 tablet (800 mg total) by mouth 3 (three) times daily. 30 tablet Georgetta Haber, NP     PDMP not reviewed this encounter.   Georgetta Haber, NP 10/01/19 1223

## 2019-10-01 NOTE — ED Triage Notes (Signed)
Pt c/o dental pain to upper/lower left molar area for approx 1 week. Denies facial swelling/numbness, foul taste in mouth or fevers or trauma. Has been using orajel to area.

## 2020-10-04 ENCOUNTER — Other Ambulatory Visit (HOSPITAL_COMMUNITY)
Admission: RE | Admit: 2020-10-04 | Discharge: 2020-10-04 | Disposition: A | Discharge status: Home / Self Care | Payer: Medicaid Other | Source: Ambulatory Visit | Attending: Obstetrics and Gynecology | Admitting: Obstetrics and Gynecology

## 2020-10-04 ENCOUNTER — Other Ambulatory Visit: Payer: Self-pay

## 2020-10-04 ENCOUNTER — Encounter: Payer: Self-pay | Admitting: Obstetrics and Gynecology

## 2020-10-04 ENCOUNTER — Ambulatory Visit (INDEPENDENT_AMBULATORY_CARE_PROVIDER_SITE_OTHER): Payer: Medicaid Other | Admitting: Obstetrics and Gynecology

## 2020-10-04 VITALS — BP 120/85 | HR 85 | Temp 97.4°F | Ht 61.0 in | Wt 297.2 lb

## 2020-10-04 DIAGNOSIS — Z3200 Encounter for pregnancy test, result unknown: Secondary | ICD-10-CM | POA: Diagnosis not present

## 2020-10-04 DIAGNOSIS — Z01419 Encounter for gynecological examination (general) (routine) without abnormal findings: Secondary | ICD-10-CM | POA: Diagnosis present

## 2020-10-04 DIAGNOSIS — L732 Hidradenitis suppurativa: Secondary | ICD-10-CM

## 2020-10-04 DIAGNOSIS — Z30432 Encounter for removal of intrauterine contraceptive device: Secondary | ICD-10-CM

## 2020-10-04 DIAGNOSIS — Z3040 Encounter for surveillance of contraceptives, unspecified: Secondary | ICD-10-CM | POA: Diagnosis not present

## 2020-10-04 DIAGNOSIS — B372 Candidiasis of skin and nail: Secondary | ICD-10-CM | POA: Insufficient documentation

## 2020-10-04 DIAGNOSIS — N898 Other specified noninflammatory disorders of vagina: Secondary | ICD-10-CM | POA: Insufficient documentation

## 2020-10-04 LAB — POCT URINE PREGNANCY: Preg Test, Ur: NEGATIVE

## 2020-10-04 MED ORDER — TRIAMCINOLONE ACETONIDE 0.1 % EX CREA: 1.0000 "application " | TOPICAL_CREAM | Freq: Two times a day (BID) | CUTANEOUS | 3 refills | Status: DC

## 2020-10-04 MED ORDER — NYSTATIN 100000 UNIT/GM EX CREA: 1.0000 "application " | TOPICAL_CREAM | Freq: Two times a day (BID) | CUTANEOUS | 3 refills | Status: DC

## 2020-10-04 MED ORDER — TERCONAZOLE 0.4 % VA CREA: 1.0000 | TOPICAL_CREAM | Freq: Every day | VAGINAL | 0 refills | Status: DC

## 2020-10-04 NOTE — Progress Notes (Signed)
WELL-WOMAN PHYSICAL & PAP Patient name: Brianna Stein MRN [id number]  Date of birth: 08/23/1990 Chief Complaint:   Gynecologic Exam  History of Present Illness:   Brianna Stein is a 30 y.o. [phone] African American female being seen today for a routine well-woman exam.  Current complaints: requesting to have IUD removed (she wants to TTC), she had a (-) HPT last week, she request a UPT today, vaginal irritation, dry skin under breasts and abdomen.  PCP: none      does not desire labs Patient's last menstrual period was 09/19/2020 (exact date). The current method of family planning is IUD.  Last pap 11/06/2018. Results were: normal Last mammogram: n/a. Results were:  n/a . Family h/o breast cancer: No Last colonoscopy: n/a. Results were:  n/a . Family h/o colorectal cancer: No Review of Systems:   Pertinent items are noted in HPI Denies any headaches, blurred vision, fatigue, shortness of breath, chest pain, abdominal pain, abnormal vaginal discharge/itching/odor/irritation, problems with periods, bowel movements, urination, or intercourse unless otherwise stated above. Pertinent History Reviewed:  Reviewed past medical,surgical, social and family history.  Reviewed problem list, medications and allergies. Physical Assessment:   Vitals:   10/04/20 1100  BP: 120/85  Pulse: 85  Temp: (!) 97.4 F (36.3 C)  TempSrc: Oral  Weight: 297 lb 3.2 oz (134.8 kg)  Height: 5\' 1"  (1.549 m)  Body mass index is 56.16 kg/m.        Physical Examination:   General appearance - well appearing, and in no distress  Mental status - alert, oriented to person, place, and time  Psych:  She has a normal mood and affect  Skin - warm and dry, normal color, no suspicious lesions noted  Chest - effort normal, all lung fields clear to auscultation bilaterally  Heart - normal rate and regular rhythm  Neck:  midline trachea, no thyromegaly or nodules  Breasts - breasts appear normal, no suspicious masses,  no skin or nipple changes or  axillary nodes  Abdomen - soft, nontender, nondistended, no masses or organomegaly  Pelvic - VULVA: normal appearing vulva with no masses, tenderness or lesions  VAGINA: normal appearing vagina with normal color and discharge, no lesions  CERVIX: normal appearing cervix without discharge or lesions, no CMT  Thin prep pap is not done  UTERUS: uterus is felt to be normal size, shape, consistency and nontender   ADNEXA: No adnexal masses or tenderness noted.  Rectal - deferred  Extremities:  No swelling or varicosities noted  Results for orders placed or performed in visit on 10/04/20 (from the past 24 hour(s))  POCT urine pregnancy   Collection Time: 10/04/20 11:53 AM  Result Value Ref Range   Preg Test, Ur Negative Negative    Assessment & Plan:  1) Well-Woman Exam without Pap  2) Women's annual routine gynecological examination  - Cervicovaginal ancillary only( [place])  3) Vaginal discharge  - Cervicovaginal ancillary only( [place])  4) Encounter for surveillance of contraceptive device  5) Encounter for IUD removal  - POCT urine pregnancy  6) Vaginal irritation  7) Cutaneous candidiasis  - Rx for terconazole (TERAZOL 7) 0.4 % vaginal cream,  - Rx for nystatin cream (MYCOSTATIN), apply under breasts and pannus  - Rx for triamcinolone cream (KENALOG) 0.1 %  apply under breasts and pannus   8) Hidradenitis suppurativa  - Ambulatory referral to Dermatology  9) Possible pregnancy  - POCT urine pregnancy  - Negative UPT - Advised to call  office with (+) HPT or prn  Labs/procedures today: UPT and IUD removal (see separate procedure note)  Mammogram at age 66 or sooner if problems Colonoscopy at age 45 or sooner if problems  Orders Placed This Encounter  Procedures   Ambulatory referral to Dermatology   POCT urine pregnancy    Meds:  Meds ordered this encounter  Medications   terconazole (TERAZOL 7) 0.4 % vaginal cream    Sig:  Place 1 applicator vaginally at bedtime. Use for seven days    Dispense:  45 g    Refill:  0    Order Specific Question:   Supervising Provider    Answer:   Reva Bores [[id number]]   nystatin cream (MYCOSTATIN)    Sig: Apply 1 application topically 2 (two) times daily. Apply to all external affected areas    Dispense:  30 g    Refill:  3    Order Specific Question:   Supervising Provider    Answer:   Reva Bores [[id number]]   triamcinolone cream (KENALOG) 0.1 %    Sig: Apply 1 application topically 2 (two) times daily. Apply to all external affected areas    Dispense:  30 g    Refill:  3    Order Specific Question:   Supervising Provider    Answer:   Reva Bores [[id number]]    Follow-up: Return in about 1 year (around 10/04/2021) for Annual Exam with Pap.  Raelyn Mora MSN, CNM 10/04/2020 11:30 AM

## 2020-10-04 NOTE — Progress Notes (Signed)
   IUD Removal Procedure Note   Patient is 30 y.o. Brianna Stein who is here for Paragard IUD removal. She would like IUD removed secondary to desiring pregnancy. She has had some issues with IUD; lower pelvic cramping. She understands that she could get pregnant after removal of IUD if she does not use another form of contraception. She has other complaints today - addressed in AEX note. Reviewed risks of removal including pain, bleeding, difficult removal and inability to remove IUD which may require surgical removal in OR. She affirms that she would like IUD removed.  BP 120/85 (BP Location: Left Arm, Patient Position: Sitting, Cuff Size: Large)   Pulse 85   Temp (!) 97.4 F (36.3 C) (Oral)   Ht 5\' 1"  (1.549 m)   Wt 297 lb 3.2 oz (134.8 kg)   LMP 09/19/2020 (Exact Date)   Breastfeeding No   BMI 56.16 kg/m   Patient with normal appearing external female genitalia. Graves speculum placed in vagina and Paragard IUD strings easily visualized. Strings grasped with ring forceps and removed easily. Minimal bleeding noted. All instruments removed from vagina. Patient tolerated procedure very well.    She was given post removal instructions. She is not planning on using any contraception. Will start TTC.  Return 1 year for annual or prn.    09/21/2020, MSN, CNM 10/04/2020 11:38 AM

## 2020-10-06 LAB — CERVICOVAGINAL ANCILLARY ONLY
Bacterial Vaginitis (gardnerella): NEGATIVE
Candida Glabrata: NEGATIVE
Candida Vaginitis: POSITIVE — AB
Comment: NEGATIVE
Comment: NEGATIVE
Comment: NEGATIVE

## 2020-10-09 ENCOUNTER — Telehealth: Payer: Self-pay

## 2020-10-09 DIAGNOSIS — N898 Other specified noninflammatory disorders of vagina: Secondary | ICD-10-CM

## 2020-10-09 MED ORDER — TERCONAZOLE 0.4 % VA CREA: 1.0000 | TOPICAL_CREAM | Freq: Every day | VAGINAL | 0 refills | Status: AC

## 2020-10-09 NOTE — Telephone Encounter (Addendum)
-----   Message from Brianna Stein, [place] sent at 10/09/2020  6:22 AM EDT ----- Please send Rx for Terazol cream for 7 days. I Rx'd Nystatin and Triamcinolone creams for yeast on skin folds.   Left a Voicemail for pt to call office back.  No HIPAA breached  Gearldine Shown, CMA

## 2020-11-13 IMAGING — US US MFM OB FOLLOW-UP
1 series · 13 of 28 positions shown · non-contrast
Comparison: none

[Series 1: us mfm ob follow-up · 13 of 83 slices shown]
[im 4/83]
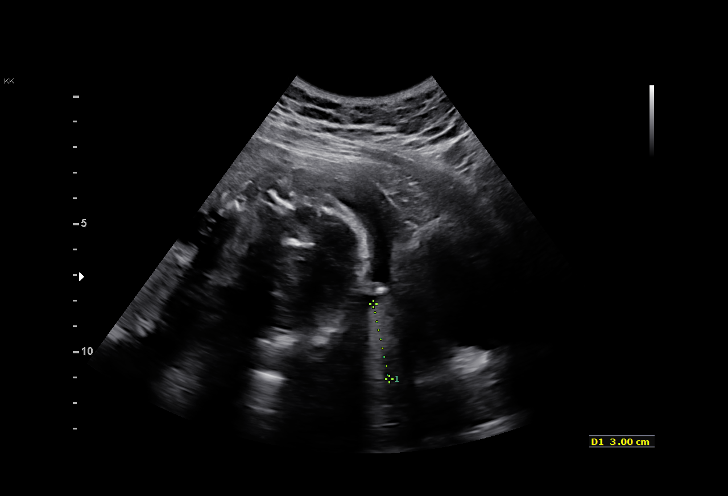
[im 10/83]
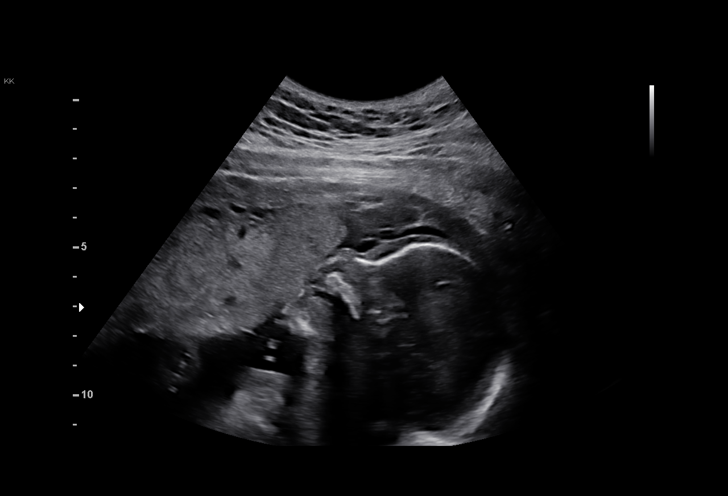
[im 16/83]
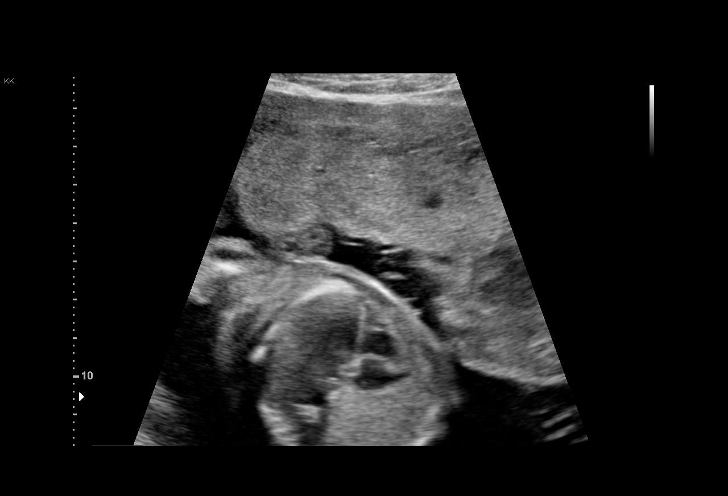
[im 22/83]
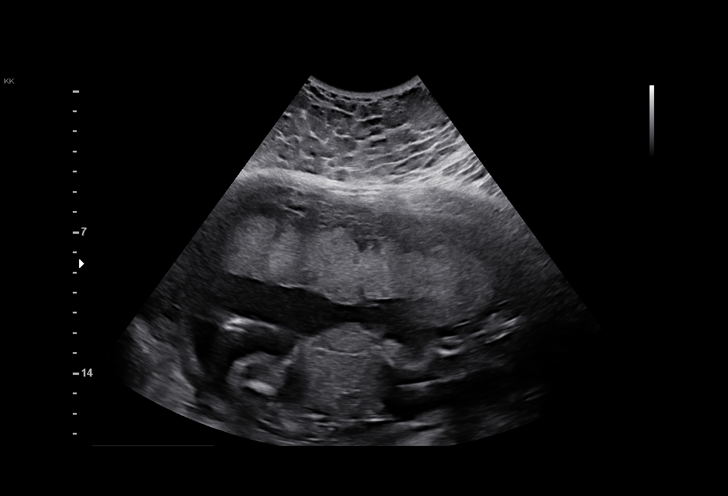
[im 28/83]
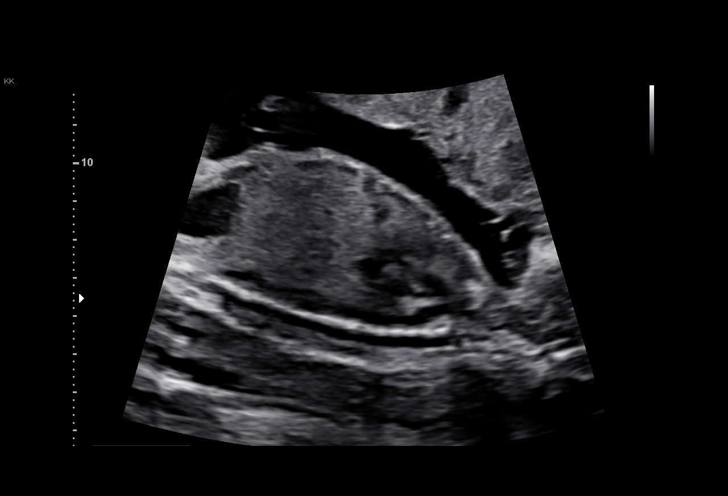
[im 34/83]
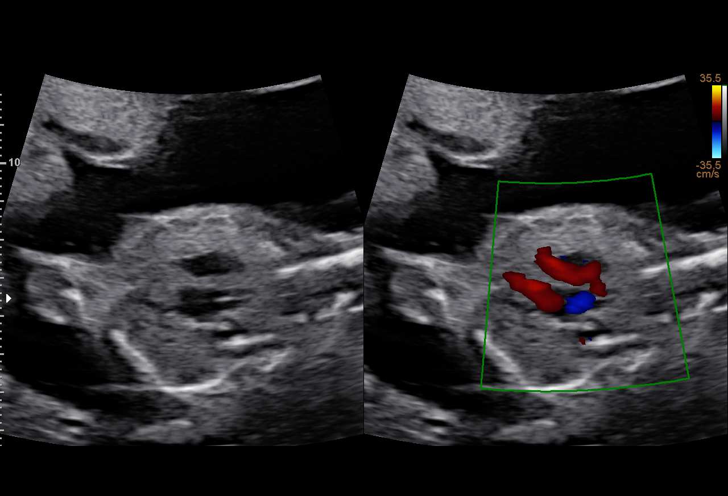
[im 43/83]
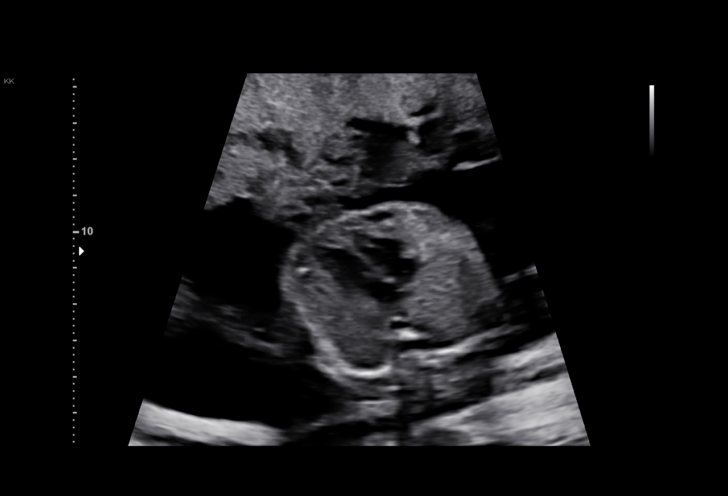
[im 49/83]
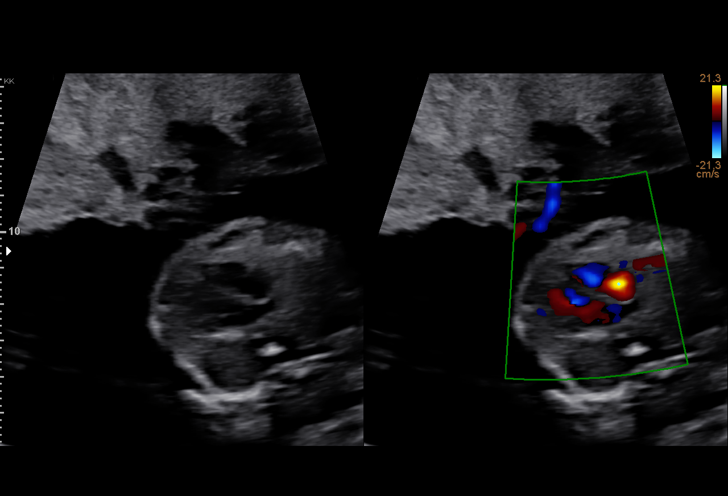
[im 55/83]
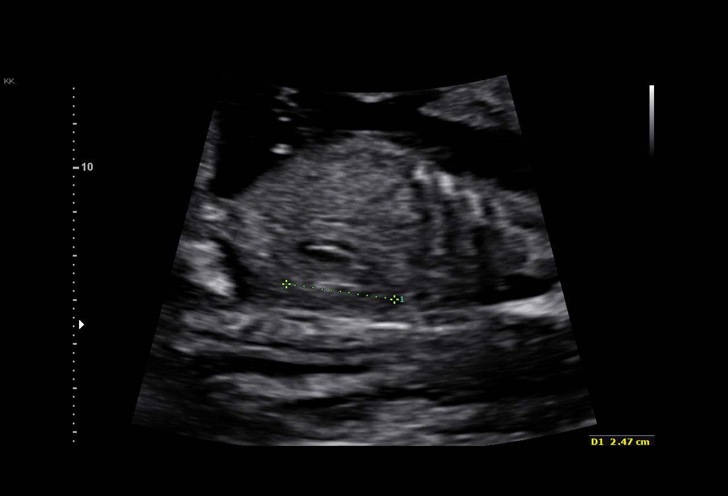
[im 61/83]
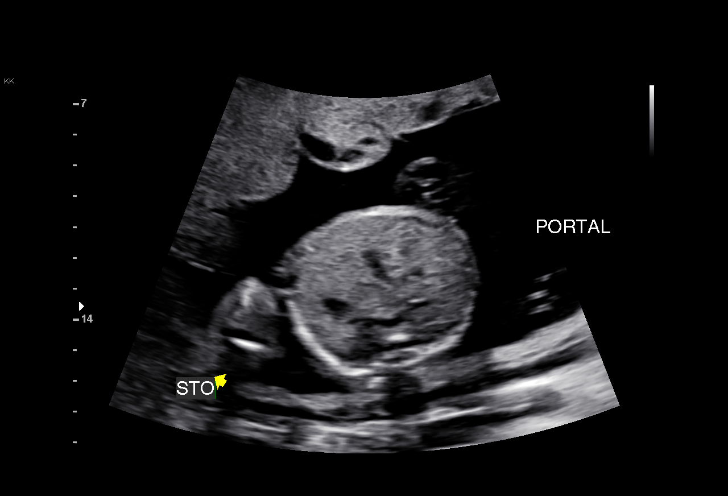
[im 67/83]
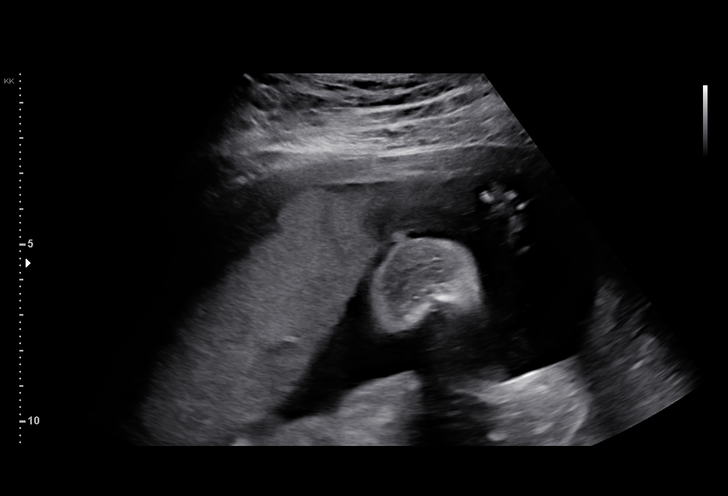
[im 73/83]
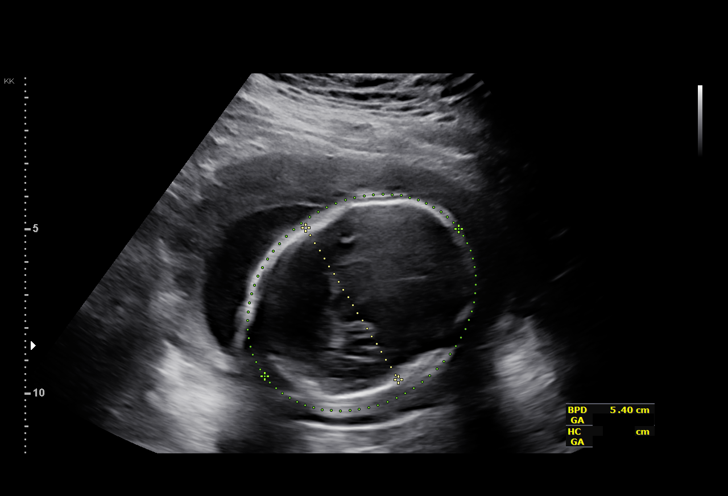
[im 79/83]
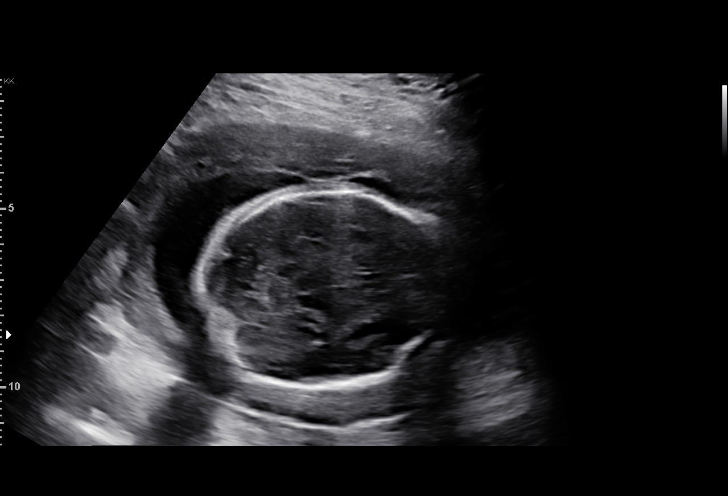

[13 of 28 positions shown; findings below may reference images not displayed]

ALYNA
 ----------------------------------------------------------------------

 ----------------------------------------------------------------------
Indications

  Obesity complicating pregnancy (Low Risk
  NIPS)
  Antenatal follow-up for nonvisualized fetal
  anatomy
  23 weeks gestation of pregnancy
 ----------------------------------------------------------------------
Vital Signs

 BMI:
Fetal Evaluation

 Num Of Fetuses:         1
 Fetal Heart Rate(bpm):  150
 Cardiac Activity:       Observed
 Presentation:           Cephalic
 Placenta:               Anterior
 P. Cord Insertion:      Previously Visualized

 Amniotic Fluid
 AFI FV:      Within normal limits

                             Largest Pocket(cm)

Biometry

 BPD:      53.7  mm     G. Age:  22w 2d         21  %    CI:        70.38   %    70 - 86
                                                         FL/HC:      19.8   %    19.2 -
 HC:      204.1  mm     G. Age:  22w 4d         19  %    HC/AC:      1.04        1.05 -
 AC:      196.7  mm     G. Age:  24w 2d         82  %    FL/BPD:     75.2   %    71 - 87
 FL:       40.4  mm     G. Age:  23w 0d         40  %    FL/AC:      20.5   %    20 - 24

 Est. FW:     609  gm      1 lb 5 oz     72  %
OB History

 Gravidity:    7         Term:   4        Prem:   1        SAB:   1
 Living:       5
Gestational Age

 U/S Today:     23w 0d                                        EDD:   06/01/19
 Best:          23w 0d     Det. By:  Early Ultrasound         EDD:   06/01/19
                                     (10/08/18)
Anatomy

 Cranium:               Appears normal         LVOT:                   Appears normal
 Cavum:                 Appears normal         Aortic Arch:            Appears normal
 Ventricles:            Appears normal         Ductal Arch:            Appears normal
 Choroid Plexus:        Appears normal         Diaphragm:              Appears normal
 Cerebellum:            Appears normal         Stomach:                Appears normal, left
                                                                       sided
 Posterior Fossa:       Appears normal         Abdomen:                Appears normal
 Nuchal Fold:           Previously seen        Abdominal Wall:         Previously seen
 Face:                  Appears normal         Cord Vessels:           Appears normal (3
                        (orbits and profile)                           vessel cord)
 Lips:                  Appears normal         Kidneys:                Appear normal
 Palate:                Appears normal         Bladder:                Appears normal
 Thoracic:              Appears normal         Spine:                  Not well visualized
 Heart:                 Appears normal         Upper Extremities:      Previously seen
                        (4CH, axis, and si
 RVOT:                  Previously seen        Lower Extremities:      Previously seen

 Other:  Technically difficult due to maternal habitus and early GA. BMI:57.4
Cervix Uterus Adnexa

 Cervix
 Length:              3  cm.
 Normal appearance by transabdominal scan.
Impression

 Patient return for completion of fetal anatomy.  Small
 ventricular septal defect was suspected on previous scan.
 Patient had fetal echocardiography at [HOSPITAL] and the VSD
 could not be appreciated on several views.  However they
 could not completely rule out VSD.

 Fetal growth is appropriate for gestational age. Amniotic fluid
 is normal and good fetal activity is seen.  Fetal face appears
 normal.  Fetal spine could not be evaluated because of fetal
 position.

 Maternal obesity imposes limitations on the resolution of
 images, and failure to detect fetal anomalies is more common
 in obese pregnant women. As maternal obesity makes
 clinical assessment of fetal growth difficult, we recommend
 serial growth scans until delivery.
Recommendations

 -An appointment was made for her to return in 4 weeks for
 fetal growth assessment (and evaluate spine if possible).
                 Luy, Ricardo Hugo

## 2020-12-18 IMAGING — US US MFM OB FOLLOW-UP
1 series · 14 of 28 positions shown · non-contrast
Comparison: none

[Series 1: us mfm ob follow-up · 45 acquisitions, 14 frames shown]
[im 2/45]
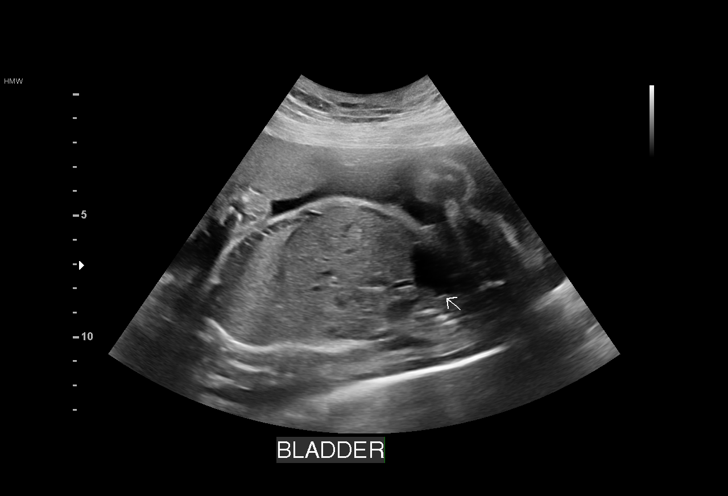
[im 5/45]
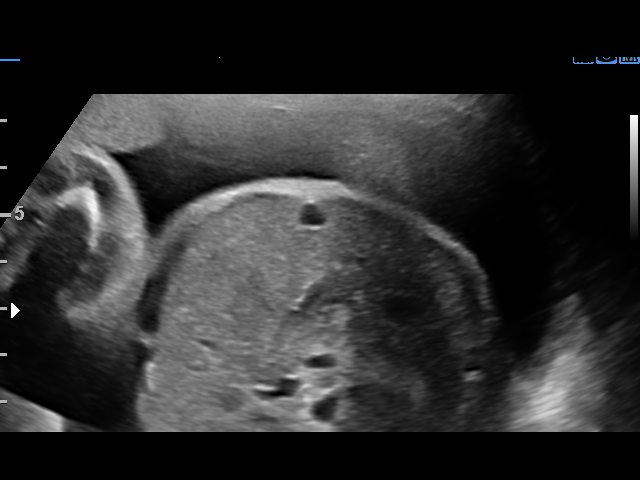
[im 9/45]
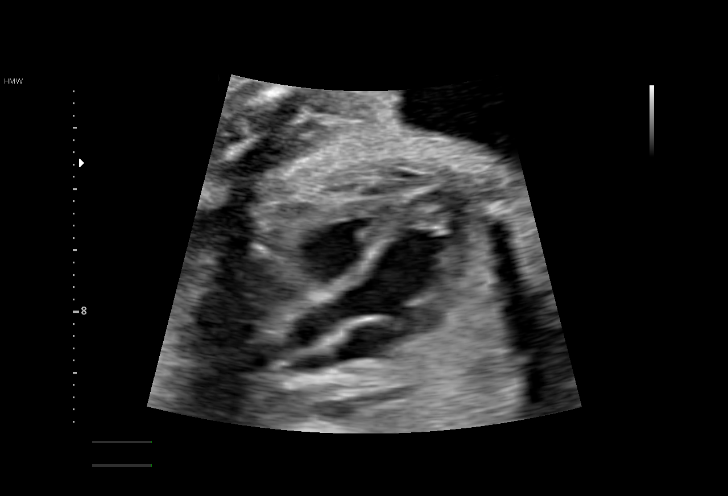
[im 12/45]
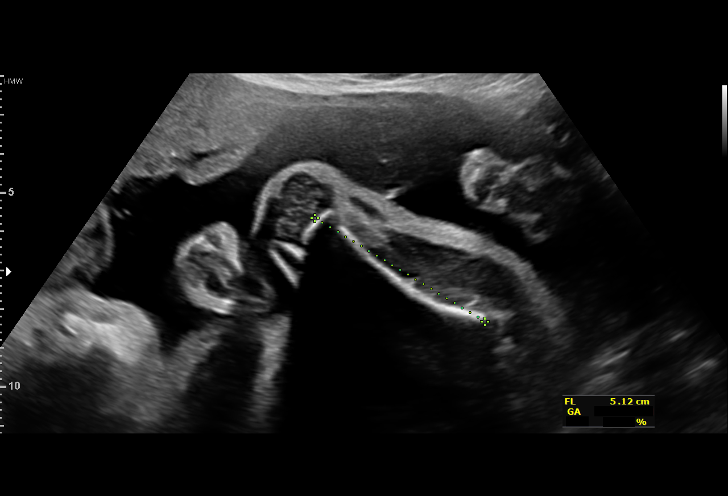
[im 15/45]
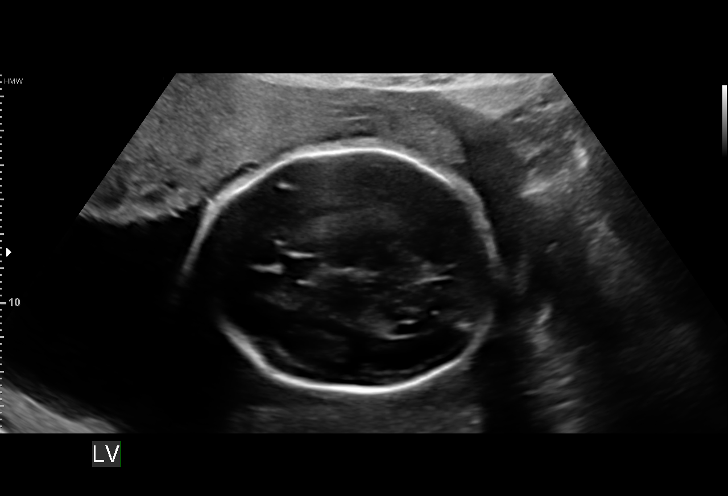
[im 18/45]
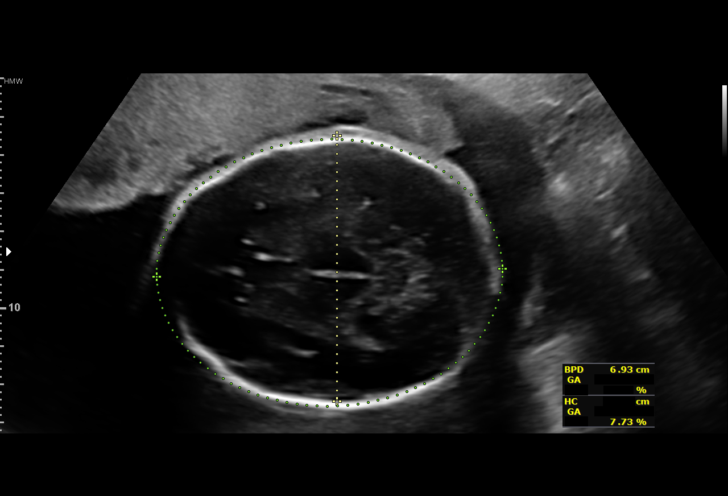
[im 22/45]
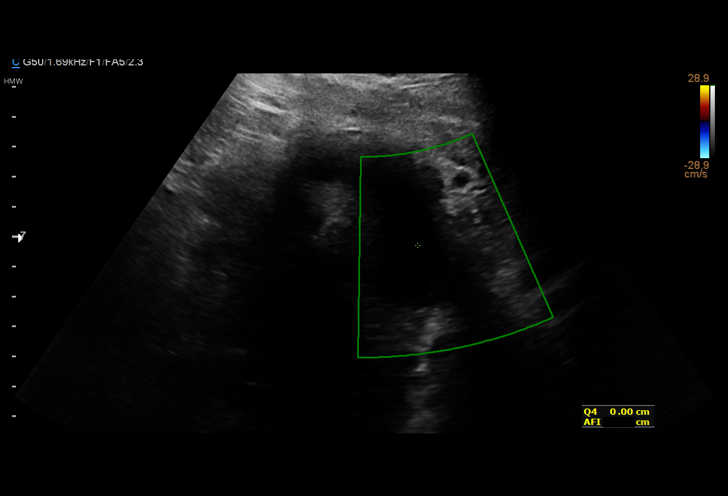
[im 25/45]
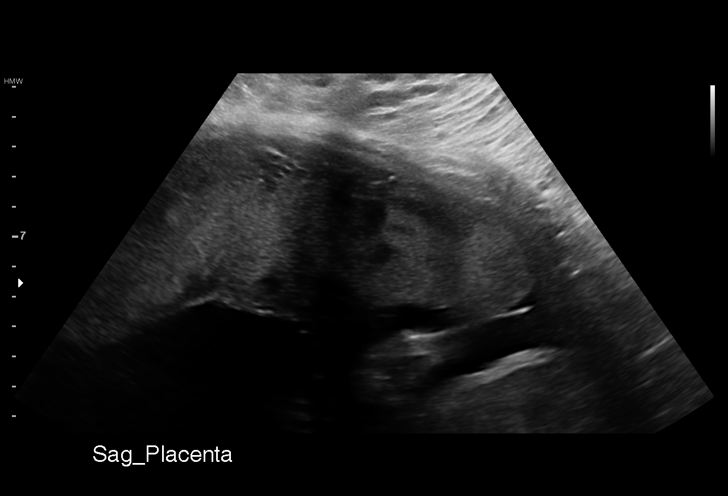
[im 28/45]
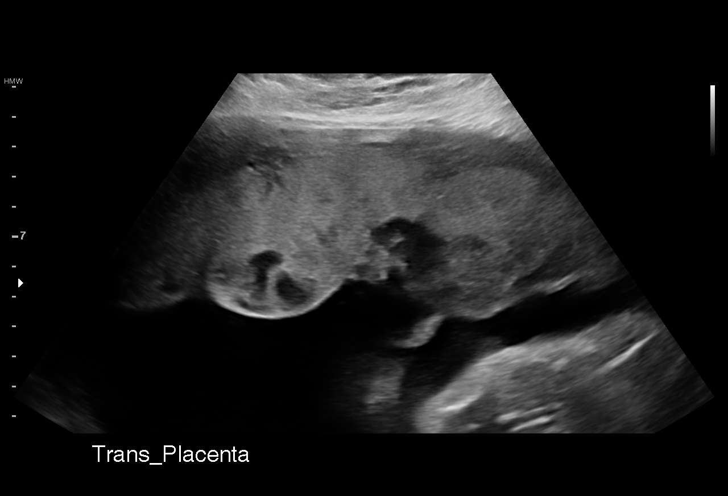
[im 31/45]
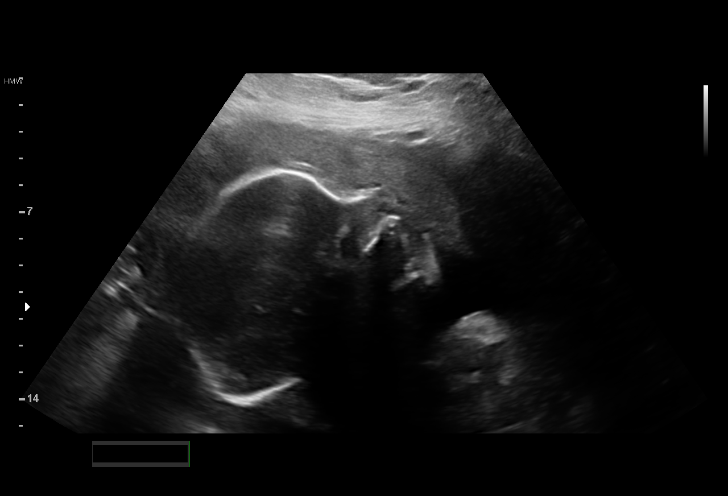
[im 35/45]
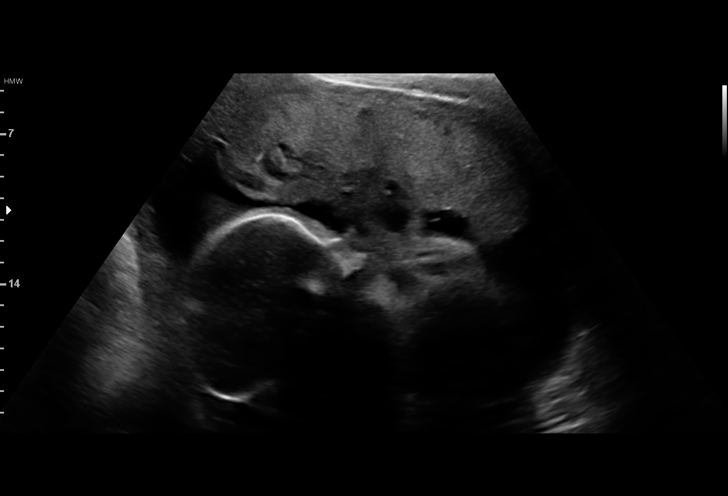
[im 38/45]
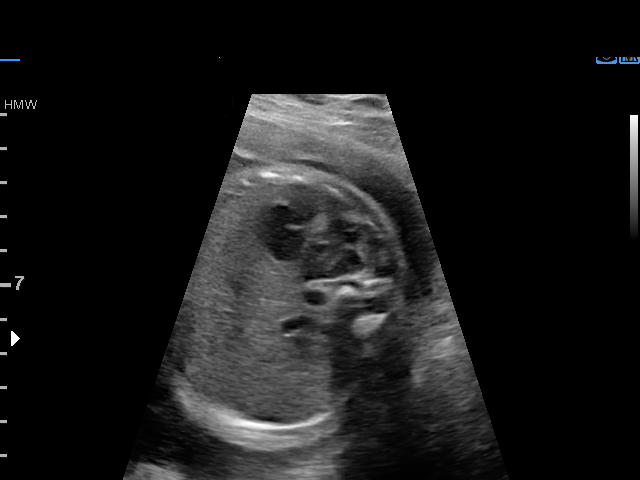
[im 41/45]
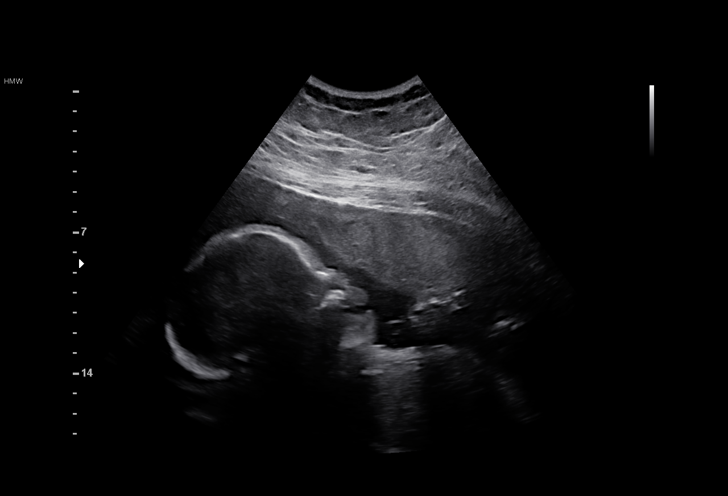
[im 45/45]
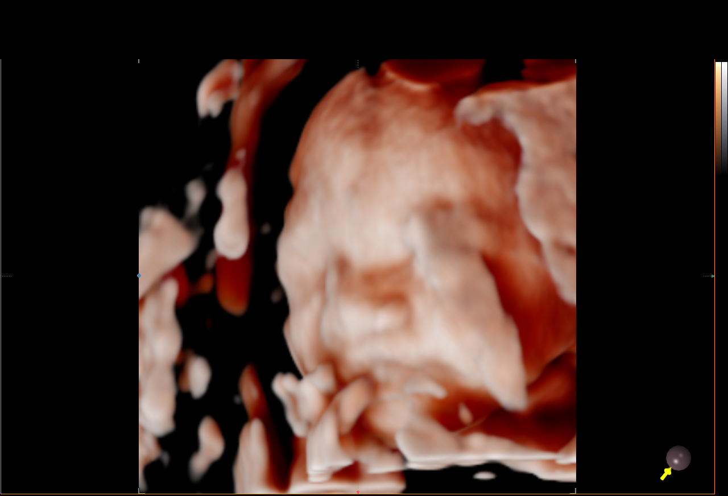

[14 of 28 positions shown; findings below may reference images not displayed]

----------------------------------------------------------------------

 ----------------------------------------------------------------------
Indications

  Obesity complicating pregnancy (Low Risk
  NIPS)
  28 weeks gestation of pregnancy
  Antenatal follow-up for nonvisualized fetal
  anatomy
  Low risk panorama (FF;3.3 %, male)
 ----------------------------------------------------------------------
Vital Signs

                                                Height:        5'1"
Fetal Evaluation

 Num Of Fetuses:         1
 Fetal Heart Rate(bpm):  130
 Cardiac Activity:       Observed
 Presentation:           Breech
 Placenta:               Anterior
 P. Cord Insertion:      Visualized, central

 Amniotic Fluid
 AFI FV:      Within normal limits

 AFI Sum(cm)     %Tile       Largest Pocket(cm)
 13.86           44

 RUQ(cm)       RLQ(cm)       LUQ(cm)        LLQ(cm)
 4.63          0
Biometry
 BPD:      69.1  mm     G. Age:  27w 5d         31  %    CI:        74.86   %    70 - 86
                                                         FL/HC:      20.0   %    18.8 -
 HC:      253.4  mm     G. Age:  27w 4d         10  %    HC/AC:      1.02        1.05 -
 AC:      248.9  mm     G. Age:  29w 1d         76  %    FL/BPD:     73.5   %    71 - 87
 FL:       50.8  mm     G. Age:  27w 2d         16  %    FL/AC:      20.4   %    20 - 24
 HUM:      48.4  mm     G. Age:  28w 3d         55  %

 Est. FW:    5522  gm    2 lb 10 oz      46  %
OB History

 Gravidity:    7         Term:   4        Prem:   1        SAB:   1
 Living:       5
Gestational Age

 U/S Today:     28w 0d                                        EDD:   06/01/19
 Best:          28w 0d     Det. By:  Early Ultrasound         EDD:   06/01/19
                                     (10/08/18)
Anatomy

 Cranium:               Appears normal         LVOT:                   Appears normal
 Cavum:                 Previously seen        Aortic Arch:            Previously seen
 Ventricles:            Appears normal         Ductal Arch:            Previously seen
 Choroid Plexus:        Previously seen        Diaphragm:              Previously seen
 Cerebellum:            Previously seen        Stomach:                Appears normal, left
                                                                       sided
 Posterior Fossa:       Previously seen        Abdomen:                Previously seen
 Nuchal Fold:           Previously seen        Abdominal Wall:         Previously seen
 Face:                  Orbits and profile     Cord Vessels:           Previously seen
                        previously seen
 Lips:                  Previously seen        Kidneys:                Appear normal
 Palate:                Previously seen        Bladder:                Previously seen
 Thoracic:              Appears normal         Spine:                  Not well visualized
 Heart:                 Previously seen        Upper Extremities:      Previously seen
 RVOT:                  Appears normal         Lower Extremities:      Previously seen

 Other:  Technically difficult due to maternal habitus and early GA. BMI:57.4
Cervix Uterus Adnexa

 Cervix
 Not visualized (advanced GA >65wks)

 Uterus
 No abnormality visualized.

 Left Ovary
 No adnexal mass visualized.

 Right Ovary
 No adnexal mass visualized.

 Cul De Sac
 No free fluid seen.

 Adnexa
 No abnormality visualized.
Comments

 This patient was seen for a follow up growth scan due to
 maternal obesity.  She denies any problems since her last
 exam.
 She was informed that the fetal growth and amniotic fluid
 level appears appropriate for her gestational age.
 Due to maternal obesity, a follow up exam was scheduled in
 4 weeks.

## 2021-01-15 IMAGING — US US MFM OB FOLLOW-UP
1 series · 14 of 28 positions shown · non-contrast
Comparison: none

[Series 1: us mfm ob follow-up · 33 acquisitions, 14 frames shown]
[im 2/33]
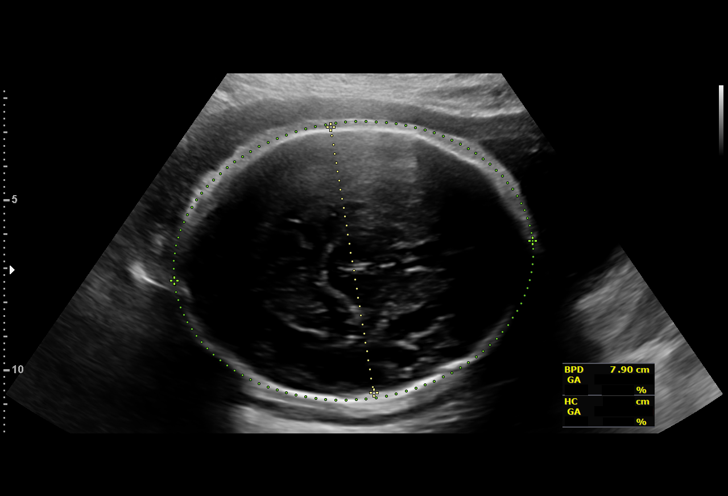
[im 4/33]
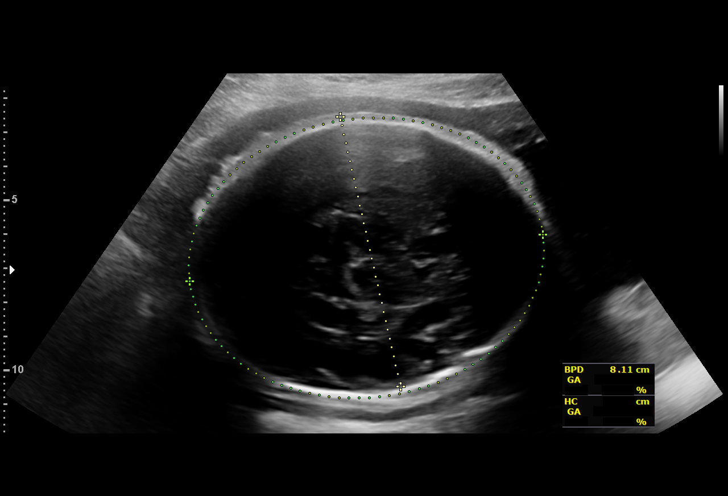
[im 6/33]
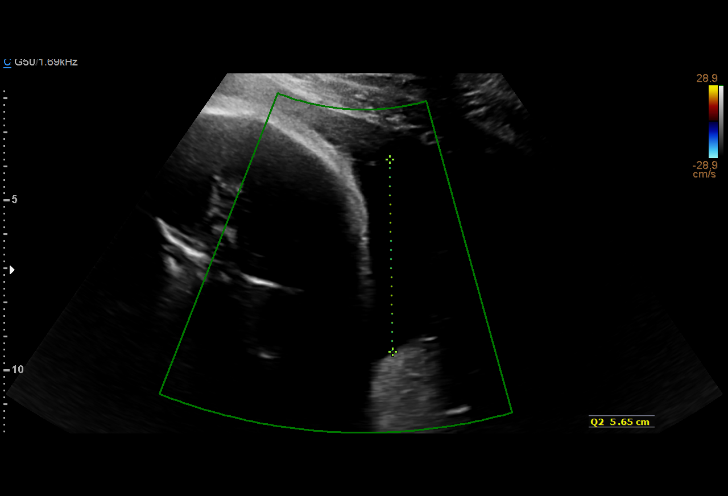
[im 9/33]
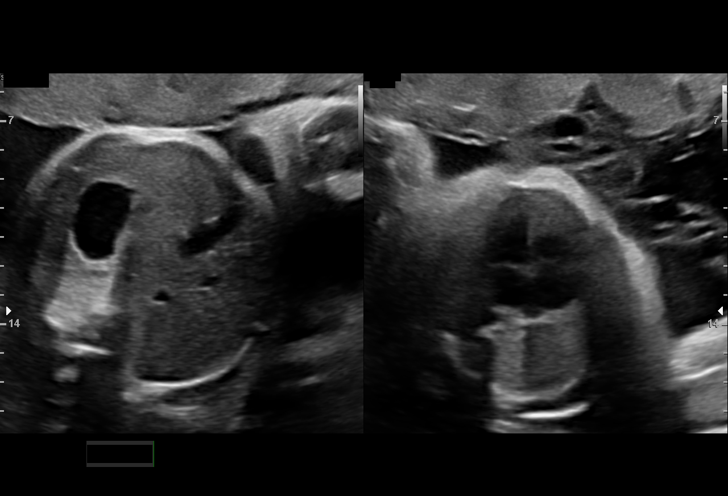
[im 11/33]
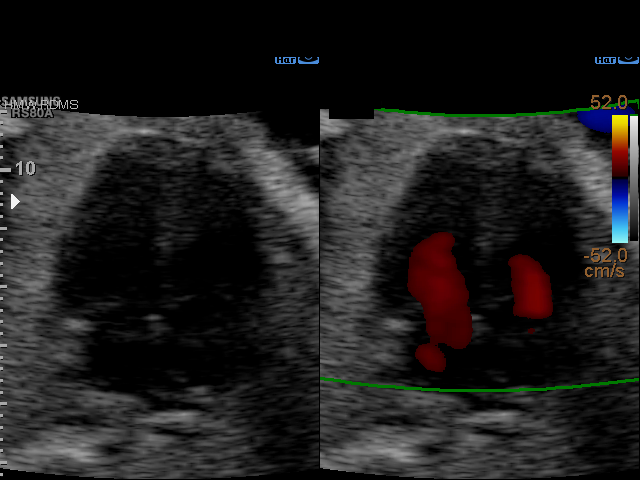
[im 14/33]
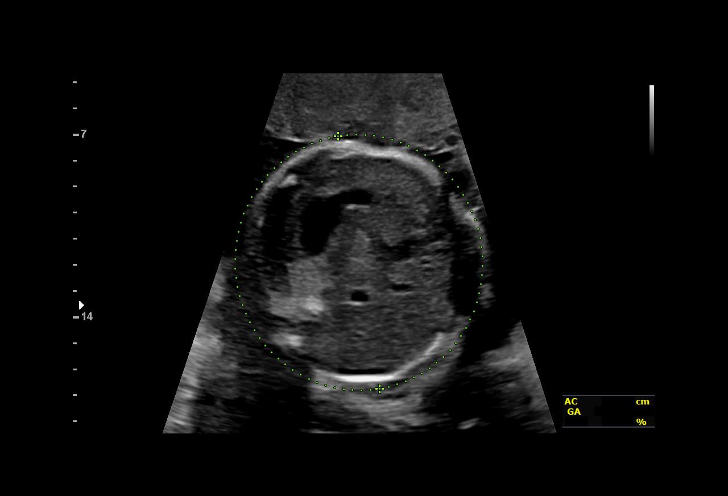
[im 16/33]
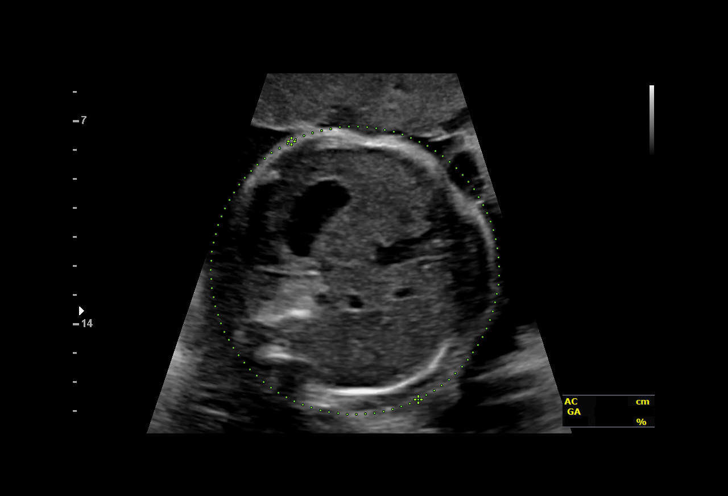
[im 18/33]
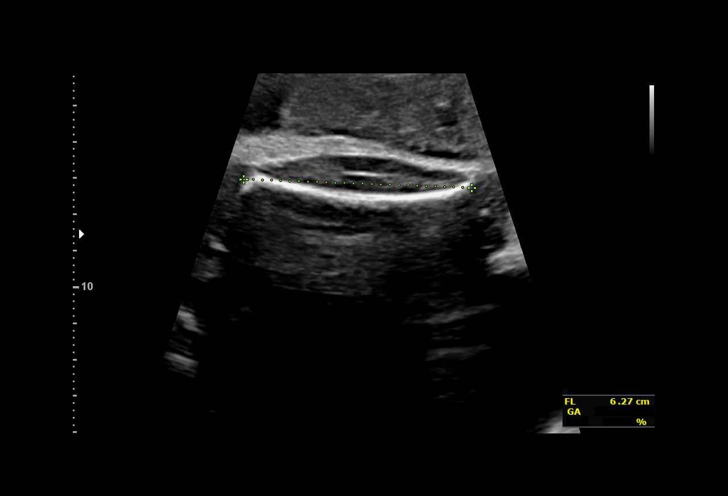
[im 21/33]
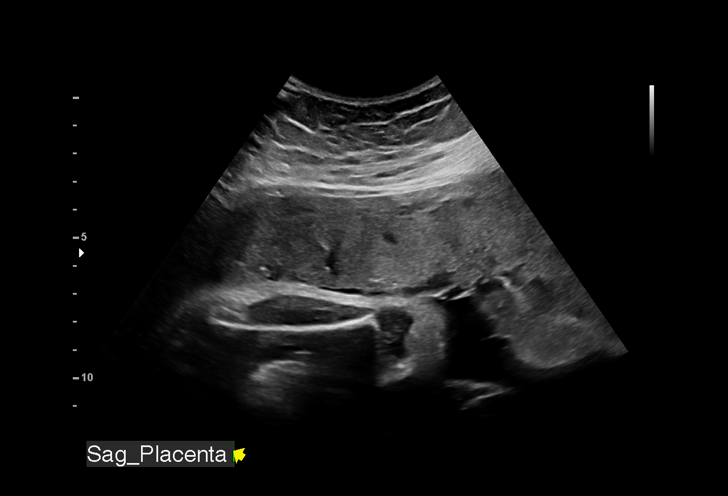
[im 23/33]
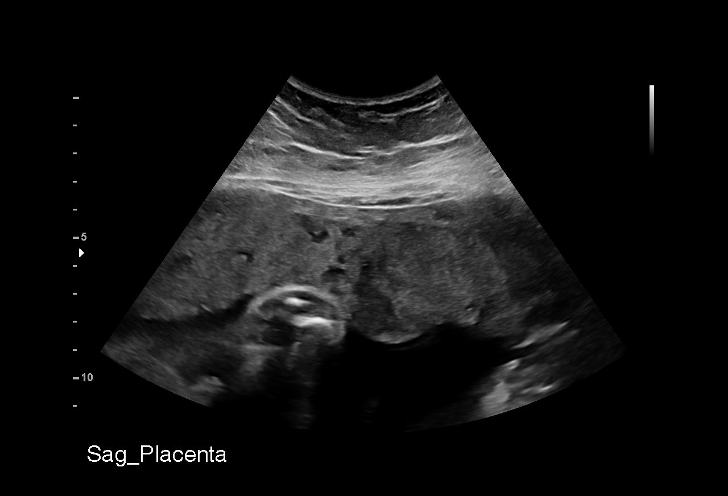
[im 25/33]
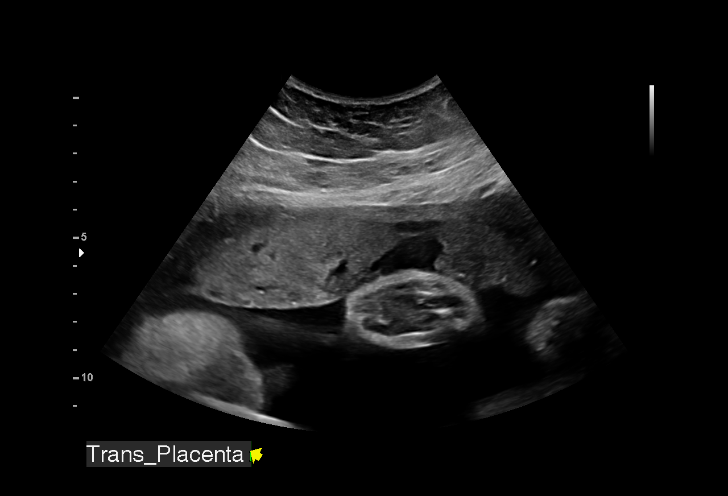
[im 28/33]
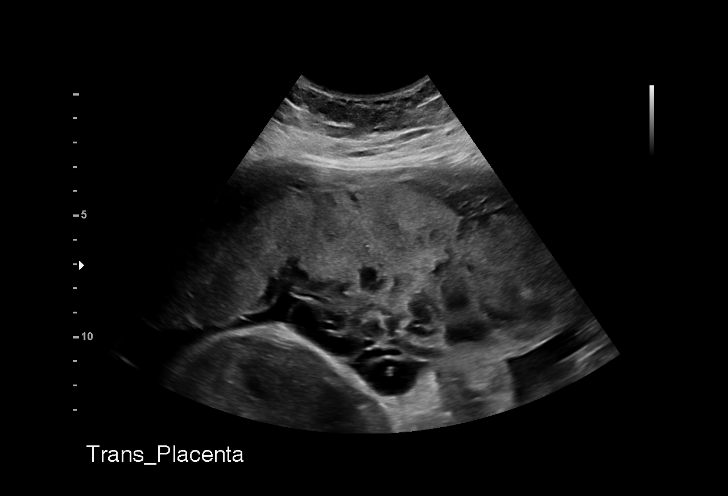
[im 30/33]
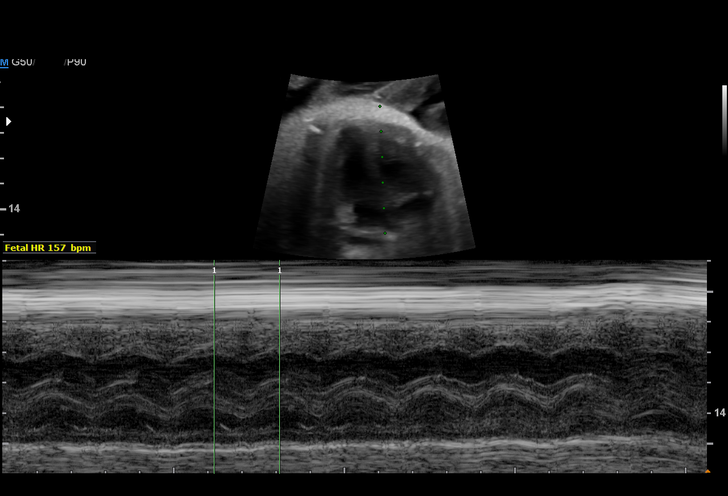
[im 33/33]
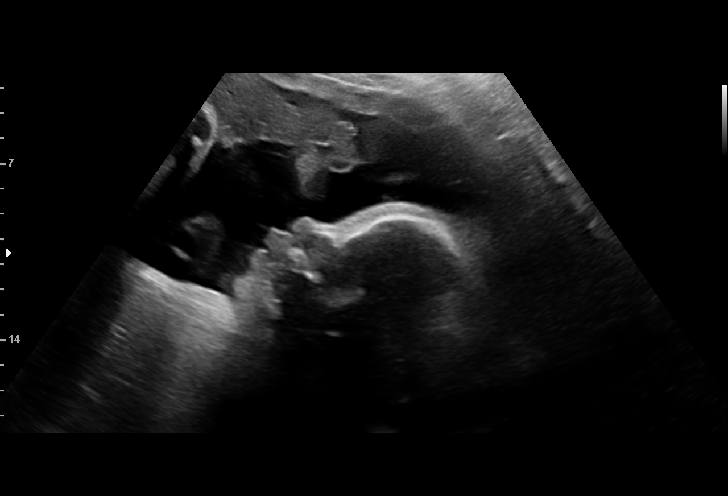

[14 of 28 positions shown; findings below may reference images not displayed]

----------------------------------------------------------------------

 ----------------------------------------------------------------------
Indications

  32 weeks gestation of pregnancy
  Obesity complicating pregnancy (pregravid
  BMI 47.4)
  Low risk panorama (FF;3.3 %, male)
  Encounter for other antenatal screening
  follow-up
 ----------------------------------------------------------------------
Vital Signs

                                                Height:        5'1"
Fetal Evaluation

 Num Of Fetuses:          1
 Fetal Heart Rate(bpm):   157
 Cardiac Activity:        Observed
 Presentation:            Cephalic
 Placenta:                Anterior
 P. Cord Insertion:       Previously Visualized

 Amniotic Fluid
 AFI FV:      Within normal limits

 AFI Sum(cm)     %Tile       Largest Pocket(cm)
 16.57           60

 RUQ(cm)       RLQ(cm)       LUQ(cm)        LLQ(cm)

Biometry
 BPD:      79.9  mm     G. Age:  32w 1d         43  %    CI:        73.42   %    70 - 86
                                                         FL/HC:       21.0  %    19.1 -
 HC:      296.3  mm     G. Age:  32w 5d         32  %    HC/AC:       0.96       0.96 -
 AC:      309.6  mm     G. Age:  34w 6d         99  %    FL/BPD:      78.0  %    71 - 87
 FL:       62.3  mm     G. Age:  32w 2d         44  %    FL/AC:       20.1  %    20 - 24

 Est. FW:    8832   gm   4 lb 15 oz      89  %
OB History

 Gravidity:    7         Term:   4        Prem:   1        SAB:   1
 Living:       5
Gestational Age

 U/S Today:     33w 0d                                        EDD:   05/25/19
 Best:          32w 0d     Det. By:  Early Ultrasound         EDD:   06/01/19
                                     (10/08/18)
Anatomy

 Cranium:               Appears normal         LVOT:                   Previously seen
 Cavum:                 Previously seen        Aortic Arch:            Previously seen
 Ventricles:            Previously seen        Ductal Arch:            Previously seen
 Choroid Plexus:        Previously seen        Diaphragm:              Previously seen
 Cerebellum:            Previously seen        Stomach:                Appears normal, left
                                                                       sided
 Posterior Fossa:       Previously seen        Abdomen:                Previously seen
 Nuchal Fold:           Previously seen        Abdominal Wall:         Previously seen
 Face:                  Orbits and profile     Cord Vessels:           Previously seen
                        previously seen
 Lips:                  Previously seen        Kidneys:                Previously seen
 Palate:                Previously seen        Bladder:                Appears normal
 Thoracic:              Appears normal         Spine:                  Not well visualized
 Heart:                 Previously seen        Upper Extremities:      Previously seen
 RVOT:                  Previously seen        Lower Extremities:      Previously seen

 Other:  Technically difficult due to maternal habitus and early GA. BMI:47.4
Cervix Uterus Adnexa

 Cervix
 Not visualized (advanced GA >99wks)

 Uterus
 No abnormality visualized.

 Left Ovary
 No adnexal mass visualized.

 Right Ovary
 No adnexal mass visualized.

 Cul De Sac
 No free fluid seen.

 Adnexa
 No abnormality visualized.
Impression

 Normal interval growth
 The spine again is suboptimally seen
 Good fetal movement and amniotic fluid
Recommendations

 Continue follow up in 4 weeks given maternal obesity.

## 2021-01-21 NOTE — L&D Delivery Note (Addendum)
OB/[facility]N Faculty Practice Delivery Note  Brianna Stein is a 31 y.o. [id number] at [redacted]w[redacted]d admitted for IOL for BMI 50.0-59.9.   GBS Status: Positive/-- (07/28 0000) Maximum Maternal Temperature: 98.3  Labor course: Initial SVE: 4 cm. Augmentation with: AROM and Pitocin. She then progressed to complete.  ROM: 2h 52m with clear fluid  Birth: At 1512 a viable female was delivered via spontaneous vaginal delivery (Presentation: LOA). Nuchal cord present: No.  Shoulders and body delivered in usual fashion. Infant placed directly on mom's abdomen for bonding/skin-to-skin, baby dried and stimulated. Cord clamped x 2 after 1 minute and cut by FOB.  Cord blood collected.  The placenta separated spontaneously and delivered via gentle cord traction.  Pitocin infused rapidly IV per protocol.  Fundus firm with massage. Large blood clots expelled with fundal massage. TXA administered at this time.  Placenta inspected and appears to be intact with a 3 VC.  Placenta/Cord with the following complications: none .  Cord pH: n/a Sponge and instrument count were correct x2.  Intrapartum complications:  None Anesthesia:  epidural Episiotomy: none Lacerations:  none Suture Repair:  n/a EBL (mL): 300   Infant: APGAR (1 MIN): 8   APGAR (5 MINS): 9   Infant weight: pending  Mom to postpartum.  Baby to Couplet care / Skin to Skin. Placenta to L&D   Plans to Bottlefeed Contraception: post-placental IUD followed by BTL in 6 weeks Circumcision: N/A  Note sent to [facility]: [facility] for pp visit.  Laury Deep , MSN, CNM 11/27/2021 3:45 PM

## 2021-04-13 ENCOUNTER — Encounter (HOSPITAL_COMMUNITY): Payer: Self-pay | Admitting: Obstetrics & Gynecology

## 2021-04-13 ENCOUNTER — Other Ambulatory Visit: Payer: Self-pay

## 2021-04-13 ENCOUNTER — Inpatient Hospital Stay (HOSPITAL_COMMUNITY): Payer: Medicaid Other

## 2021-04-13 ENCOUNTER — Inpatient Hospital Stay (HOSPITAL_COMMUNITY)
Admission: AD | Admit: 2021-04-13 | Discharge: 2021-04-13 | Disposition: A | Discharge status: Home / Self Care | Payer: Medicaid Other | Attending: Obstetrics & Gynecology | Admitting: Obstetrics & Gynecology

## 2021-04-13 ENCOUNTER — Telehealth: Payer: Self-pay | Admitting: Obstetrics and Gynecology

## 2021-04-13 DIAGNOSIS — O99891 Other specified diseases and conditions complicating pregnancy: Secondary | ICD-10-CM | POA: Diagnosis not present

## 2021-04-13 DIAGNOSIS — O208 Other hemorrhage in early pregnancy: Secondary | ICD-10-CM | POA: Insufficient documentation

## 2021-04-13 DIAGNOSIS — M5489 Other dorsalgia: Secondary | ICD-10-CM | POA: Diagnosis not present

## 2021-04-13 DIAGNOSIS — R109 Unspecified abdominal pain: Secondary | ICD-10-CM

## 2021-04-13 DIAGNOSIS — O468X1 Other antepartum hemorrhage, first trimester: Secondary | ICD-10-CM

## 2021-04-13 DIAGNOSIS — Z3491 Encounter for supervision of normal pregnancy, unspecified, first trimester: Secondary | ICD-10-CM

## 2021-04-13 DIAGNOSIS — O26891 Other specified pregnancy related conditions, first trimester: Secondary | ICD-10-CM

## 2021-04-13 DIAGNOSIS — Z3A01 Less than 8 weeks gestation of pregnancy: Secondary | ICD-10-CM

## 2021-04-13 DIAGNOSIS — M549 Dorsalgia, unspecified: Secondary | ICD-10-CM

## 2021-04-13 DIAGNOSIS — O418X10 Other specified disorders of amniotic fluid and membranes, first trimester, not applicable or unspecified: Secondary | ICD-10-CM | POA: Diagnosis not present

## 2021-04-13 LAB — CBC
HCT: 35.9 % — ABNORMAL LOW (ref 36.0–46.0)
Hemoglobin: 11.1 g/dL — ABNORMAL LOW (ref 12.0–15.0)
MCH: 26 pg (ref 26.0–34.0)
MCHC: 30.9 g/dL (ref 30.0–36.0)
MCV: 84.1 fL (ref 80.0–100.0)
Platelets: 296 10*3/uL (ref 150–400)
RBC: 4.27 MIL/uL (ref 3.87–5.11)
RDW: 14.3 % (ref 11.5–15.5)
WBC: 12.2 10*3/uL — ABNORMAL HIGH (ref 4.0–10.5)
nRBC: 0 % (ref 0.0–0.2)

## 2021-04-13 LAB — URINALYSIS, ROUTINE W REFLEX MICROSCOPIC
Bilirubin Urine: NEGATIVE
Glucose, UA: NEGATIVE mg/dL
Hgb urine dipstick: NEGATIVE
Ketones, ur: NEGATIVE mg/dL
Leukocytes,Ua: NEGATIVE
Nitrite: NEGATIVE
Protein, ur: NEGATIVE mg/dL
Specific Gravity, Urine: 1.025 (ref 1.005–1.030)
pH: 5 (ref 5.0–8.0)

## 2021-04-13 LAB — HCG, QUANTITATIVE, PREGNANCY: hCG, Beta Chain, Quant, S: 23017 m[IU]/mL — ABNORMAL HIGH (ref <5)

## 2021-04-13 LAB — POCT PREGNANCY, URINE: Preg Test, Ur: POSITIVE — AB

## 2021-04-13 MED ORDER — CYCLOBENZAPRINE HCL 10 MG PO TABS: 10.0000 mg | ORAL_TABLET | Freq: Three times a day (TID) | ORAL | 0 refills | Status: DC | PRN

## 2021-04-13 MED ORDER — ACETAMINOPHEN 500 MG PO TABS
1000.0000 mg | ORAL_TABLET | Freq: Once | ORAL | Status: AC
  Administered 2021-04-13: 1000 mg via ORAL
  Filled 2021-04-13: qty 2

## 2021-04-13 NOTE — Telephone Encounter (Signed)
Patient called to request an appointment. Informed her of the changes made at this location, and she requested the information for the [facility] office. ?

## 2021-04-13 NOTE — Discharge Instructions (Signed)
Safe Medications in Pregnancy  ° °Acne: °Benzoyl Peroxide °Salicylic Acid ° °Backache/Headache: °Tylenol: 2 regular strength every 4 hours OR °             2 Extra strength every 6 hours ° °Colds/Coughs/Allergies: °Benadryl (alcohol free) 25 mg every 6 hours as needed °Breath right strips °Claritin °Cepacol throat lozenges °Chloraseptic throat spray °Cold-Eeze- up to three times per day °Cough drops, alcohol free °Flonase (by prescription only) °Guaifenesin °Mucinex °Robitussin DM (plain only, alcohol free) °Saline nasal spray/drops °Sudafed (pseudoephedrine) & Actifed ** use only after [redacted] weeks gestation and if you do not have high blood pressure °Tylenol °Vicks Vaporub °Zinc lozenges °Zyrtec  ° °Constipation: °Colace °Ducolax suppositories °Fleet enema °Glycerin suppositories °Metamucil °Milk of magnesia °Miralax °Senokot °Smooth move tea ° °Diarrhea: °Kaopectate °Imodium A-D ° °*NO pepto Bismol ° °Hemorrhoids: °Anusol °Anusol HC °Preparation H °Tucks ° °Indigestion: °Tums °Maalox °Mylanta °Zantac  °Pepcid ° °Insomnia: °Benadryl (alcohol free) 25mg every 6 hours as needed °Tylenol PM °Unisom, no Gelcaps ° °Leg Cramps: °Tums °MagGel ° °Nausea/Vomiting:  °Bonine °Dramamine °Emetrol °Ginger extract °Sea bands °Meclizine  °Nausea medication to take during pregnancy:  °Unisom (doxylamine succinate 25 mg tablets) Take one tablet daily at bedtime. If symptoms are not adequately controlled, the dose can be increased to a maximum recommended dose of two tablets daily (1/2 tablet in the morning, 1/2 tablet mid-afternoon and one at bedtime). °Vitamin B6 100mg tablets. Take one tablet twice a day (up to 200 mg per day). ° °Skin Rashes: °Aveeno products °Benadryl cream or 25mg every 6 hours as needed °Calamine Lotion °1% cortisone cream ° °Yeast infection: °Gyne-lotrimin 7 °Monistat 7 ° °Gum/tooth pain: °Anbesol ° °**If taking multiple medications, please check labels to avoid duplicating the same active ingredients °**take  medication as directed on the label °** Do not exceed 4000 mg of tylenol in 24 hours °**Do not take medications that contain aspirin or ibuprofen ° ° ° ° °Return to care  °If you have heavier bleeding that soaks through more than 2 pads per hour for an hour or more °If you bleed so much that you feel like you might pass out or you do pass out °If you have significant abdominal pain that is not improved with Tylenol  ° ° °

## 2021-04-13 NOTE — MAU Note (Signed)
Brianna Stein is a 31 y.o. here in MAU reporting: started having back and abdominal pain while she was at work today. Feeling a little light headed. Denies bleeding or discharge. + UPT at home. ? ?LMP: 02/10/21 ? ?Onset of complaint: today ? ?Pain score: back 8/10, abdomen 5/10 ? ?Vitals:  ? 04/13/21 1742  ?BP: 118/68  ?Pulse: 79  ?Resp: 16  ?Temp: 97.9 ?F (36.6 ?C)  ?SpO2: 100%  ?   ?Lab orders placed from triage: upt ? ?

## 2021-04-13 NOTE — MAU Provider Note (Signed)
?History  ?  ? ?[id number] ? ?Arrival date and time: 04/13/21 1659 ?  ? ?Chief Complaint  ?Patient presents with  ? Back Pain  ? Abdominal Pain  ? ? ? ?HPI ?Brianna Stein is a 31 y.o. at [redacted]w[redacted]d by LMP who presents for abdominal & back pain. ?Symptoms started yesterday. Reports abdominal cramping throughout lower abdomen & into left hip/side. Also reports pain throughout her upper back. Was taking ibuprofen without relief. Denies fever/chills, n/v/d, dysuria, vaginal bleeding, vaginal discharge, or constipation.   ? ?OB History   ? ? Gravida  ?8  ? Para  ?6  ? Term  ?4  ? Preterm  ?2  ? AB  ?1  ? Living  ?6  ?  ? ? SAB  ?1  ? IAB  ?   ? Ectopic  ?   ? Multiple  ?0  ? Live Births  ?6  ?   ?  ?  ? ? ?Past Medical History:  ?Diagnosis Date  ? BMI 50.0-59.9, adult ([place]) 04/28/2019  ? ? ?Past Surgical History:  ?Procedure Laterality Date  ? NO PAST SURGERIES    ? ? ?No family history on file. ? ?No Known Allergies ? ?No current facility-administered medications on file prior to encounter.  ? ?No current outpatient medications on file prior to encounter.  ? ? ? ?ROS ?Pertinent positives and negative per HPI, all others reviewed and negative ? ?Physical Exam  ? ?BP 118/68 (BP Location: Right Arm)   Pulse 79   Temp 97.9 ?F (36.6 ?C) (Oral)   Resp 16   Ht 5\' 1"  (1.549 m)   Wt (!) 137.4 kg   LMP 02/10/2021   SpO2 100% Comment: room air  BMI 57.23 kg/m?  ? ?Patient Vitals for the past 24 hrs: ? BP Temp Temp src Pulse Resp SpO2 Height Weight  ?04/13/21 1742 118/68 97.9 ?F (36.6 ?C) Oral 79 16 100 % -- --  ?04/13/21 1738 -- -- -- -- -- -- 5\' 1"  (1.549 m) (!) 137.4 kg  ? ? ?Physical Exam ?Vitals and nursing note reviewed.  ?Constitutional:   ?   General: She is not in acute distress. ?   Appearance: She is well-developed.  ?HENT:  ?   Head: Normocephalic and atraumatic.  ?Pulmonary:  ?   Effort: Pulmonary effort is normal. No respiratory distress.  ?Abdominal:  ?   General: Abdomen is flat.  ?   Palpations: Abdomen is soft.  ?    Tenderness: There is no abdominal tenderness. There is no rebound.  ?Skin: ?   General: Skin is warm and dry.  ?Neurological:  ?   Mental Status: She is alert.  ?  ? ?Labs ?Results for orders placed or performed during the hospital encounter of 04/13/21 (from the past 24 hour(s))  ?Urinalysis, Routine w reflex microscopic     Status: None  ? Collection Time: 04/13/21  4:59 PM  ?Result Value Ref Range  ? Color, Urine YELLOW YELLOW  ? APPearance CLEAR CLEAR  ? Specific Gravity, Urine 1.025 1.005 - 1.030  ? pH 5.0 5.0 - 8.0  ? Glucose, UA NEGATIVE NEGATIVE mg/dL  ? Hgb urine dipstick NEGATIVE NEGATIVE  ? Bilirubin Urine NEGATIVE NEGATIVE  ? Ketones, ur NEGATIVE NEGATIVE mg/dL  ? Protein, ur NEGATIVE NEGATIVE mg/dL  ? Nitrite NEGATIVE NEGATIVE  ? Leukocytes,Ua NEGATIVE NEGATIVE  ?Pregnancy, urine POC     Status: Abnormal  ? Collection Time: 04/13/21  5:49 PM  ?Result Value Ref Range  ?  Preg Test, Ur POSITIVE (A) NEGATIVE  ?CBC     Status: Abnormal  ? Collection Time: 04/13/21  6:25 PM  ?Result Value Ref Range  ? WBC 12.2 (H) 4.0 - 10.5 K/uL  ? RBC 4.27 3.87 - 5.11 MIL/uL  ? Hemoglobin 11.1 (L) 12.0 - 15.0 g/dL  ? HCT 35.9 (L) 36.0 - 46.0 %  ? MCV 84.1 80.0 - 100.0 fL  ? MCH 26.0 26.0 - 34.0 pg  ? MCHC 30.9 30.0 - 36.0 g/dL  ? RDW 14.3 11.5 - 15.5 %  ? Platelets 296 150 - 400 K/uL  ? nRBC 0.0 0.0 - 0.2 %  ?hCG, quantitative, pregnancy     Status: Abnormal  ? Collection Time: 04/13/21  6:25 PM  ?Result Value Ref Range  ? hCG, Beta Chain, Quant, S 23,017 (H) <5 mIU/mL  ? ? ?Imaging ?US OB LESS THAN 14 WEEKS WITH OB TRANSVAGINAL ? ?Result Date: 04/13/2021 ?CLINICAL DATA:  Abdominal pain EXAM: OBSTETRIC <14 WK [place] AND TRANSVAGINAL OB US DOPPLER ULTRASOUND OF OVARIES TECHNIQUE: Both transabdominal and transvaginal ultrasound examinations were performed for complete evaluation of the gestation as well as the maternal uterus, adnexal regions, and pelvic cul-de-sac. Transvaginal technique was performed to assess early pregnancy.  Color and duplex Doppler ultrasound was utilized to evaluate blood flow to the ovaries. COMPARISON:  None. FINDINGS: Intrauterine gestational sac: Single Yolk sac:  Seen Embryo:  Seen Cardiac Activity: Seen Heart Rate: 123 bpm CRL:   6.1 mm   6 w 3 d                  [place] EDC: 12/04/2021 Subchorionic hemorrhage: Small linear hypoechoic structure is seen adjacent to the gestational sac suggesting subchorionic bleed. Maternal uterus/adnexae: Cervix is closed. There are no dominant adnexal masses. Pulsed Doppler evaluation of both ovaries demonstrates normal appearing low-resistance arterial and venous waveforms. IMPRESSION: Single live intrauterine pregnancy seen. Sonographically estimated gestational age is 6 weeks 3 days. Small subchorionic bleed is seen adjacent to the gestational sac. There are no dominant adnexal masses. There is no free fluid in the pelvis. Electronically Signed   By: Elmer Picker M.D.   On: 04/13/2021 20:48   ? ?MAU Course  ?Procedures ?Lab Orders    ?     Urinalysis, Routine w reflex microscopic Urine, Clean Catch    ?     CBC    ?     hCG, quantitative, pregnancy    ?     Pregnancy, urine POC    ?Meds ordered this encounter  ?Medications  ? acetaminophen (TYLENOL) tablet 1,000 mg  ? cyclobenzaprine (FLEXERIL) 10 MG tablet  ?  Sig: Take 1 tablet (10 mg total) by mouth 3 (three) times daily as needed for muscle spasms.  ?  Dispense:  30 tablet  ?  Refill:  0  ?  Order Specific Question:   Supervising Provider  ?  Answer:   Woodroe Mode [id number]  ? ?Imaging Orders    ?     US OB LESS THAN 14 WEEKS WITH OB TRANSVAGINAL    ? ? ?MDM ?+UPT ?UA,CBC, ABO/Rh, quant hCG, and [place] today to rule out ectopic pregnancy which can be life threatening.  ? ?Ultrasound shows IUP measuring [redacted]w[redacted]d - EDD updated & a subchorionic hemorrhage. Reviewed results with patient.  ? ?Given tylenol in MAU. Back pain not better. Suspect MSK & offered Rx flexeril. States she normally has to get flexeril when she's pregnant.   ?Assessment  and Plan  ? ?1. Abdominal pain during pregnancy in first trimester  ?-Reviewed return precautions  ?2. Normal IUP (intrauterine pregnancy) on prenatal ultrasound, first trimester  ?-Follow up with [facility] for prenatal care  ?3. Subchorionic hematoma in first trimester, single or unspecified fetus  ?-Rh positive ?-reviewed bleeding precautions  ?4. Back pain affecting pregnancy in first trimester  ?-Rx flexeril #30  ?5. [redacted] weeks gestation of pregnancy   ? ? ? ?Jorje Guild, NP ?04/13/21 ?9:04 PM ? ? ?

## 2021-04-26 LAB — OB RESULTS CONSOLE GBS: GBS: NEGATIVE

## 2021-05-02 ENCOUNTER — Telehealth (INDEPENDENT_AMBULATORY_CARE_PROVIDER_SITE_OTHER): Payer: Medicaid Other

## 2021-05-02 ENCOUNTER — Encounter (HOSPITAL_COMMUNITY): Payer: Self-pay

## 2021-05-02 DIAGNOSIS — O09219 Supervision of pregnancy with history of pre-term labor, unspecified trimester: Secondary | ICD-10-CM

## 2021-05-02 DIAGNOSIS — O0990 Supervision of high risk pregnancy, unspecified, unspecified trimester: Secondary | ICD-10-CM | POA: Insufficient documentation

## 2021-05-02 DIAGNOSIS — Z3A00 Weeks of gestation of pregnancy not specified: Secondary | ICD-10-CM

## 2021-05-02 DIAGNOSIS — O0940 Supervision of pregnancy with grand multiparity, unspecified trimester: Secondary | ICD-10-CM

## 2021-05-02 DIAGNOSIS — R110 Nausea: Secondary | ICD-10-CM

## 2021-05-02 MED ORDER — PROMETHAZINE HCL 25 MG PO TABS: 25.0000 mg | ORAL_TABLET | Freq: Four times a day (QID) | ORAL | 0 refills | Status: DC | PRN

## 2021-05-02 MED ORDER — GOJJI WEIGHT SCALE MISC: 1.0000 | 0 refills | Status: DC | PRN

## 2021-05-02 MED ORDER — BLOOD PRESSURE KIT DEVI: 1.0000 | 0 refills | Status: DC | PRN

## 2021-05-02 NOTE — Progress Notes (Signed)
New OB Intake ? ?I connected with  Brianna Stein on 05/02/21 at 10:15 AM EDT by MyChart Video Visit and verified that I am speaking with the correct person using two identifiers. Nurse is located at [facility] and pt is located at home. ? ?I discussed the limitations, risks, security and privacy concerns of performing an evaluation and management service by telephone and the availability of in person appointments. I also discussed with the patient that there may be a patient responsible charge related to this service. The patient expressed understanding and agreed to proceed. ? ?I explained I am completing New OB Intake today. We discussed her EDD of 12/04/2021 that is based on ultrasound on 04/13/2021. Pt is G8/P4. I reviewed her allergies, medications, Medical/Surgical/OB history, and appropriate screenings. I informed her of [facility] services. Based on history, this is a/an  pregnancy complicated by preterm labor  .  ? ?Patient Active Problem List  ? Diagnosis Date Noted  ? Supervision of high risk pregnancy, antepartum 05/02/2021  ? Cutaneous candidiasis 10/04/2020  ? Grand multiparity 04/30/2019  ? BMI 50.0-59.9, adult ([facility]) 04/28/2019  ? Preterm labor 04/27/2019  ? ? ?Concerns addressed today ?-Patient stated she's been feeling nauseous and can hardly keep any food or drink in. Sent Phenergan to pharmacy to help with her nauseous along with her Blood pressure device and weight scale.  ?-Patient would like to discuss BTL at her first initial appointment. ? ?Delivery Plans:  ?Plans to deliver at [facility].  ? ?MyChart/Babyscripts ?MyChart access verified. I explained pt will have some visits in office and some virtually. Babyscripts instructions given and order placed. Patient verifies receipt of registration text/e-mail. Account successfully created and app downloaded. ? ?Blood Pressure Cuff  ?Blood pressure cuff ordered for patient to pick-up from [employer]. Explained after first prenatal appt pt will check  weekly and document in Babyscripts. ? ?Weight scale: Patient does not  have weight scale. Weight scale ordered for patient to pick up from [employer].  ? ?Anatomy US ?Explained first scheduled [place] will be around 19 weeks. Anatomy US scheduled for 06/30/203 at 10:30AM. Pt notified to arrive at 10:15AM. ? ?Labs ?Discussed Avelina Laine genetic screening with patient. Would like both Panorama and Horizon drawn at new OB visit.Also if interested in genetic testing, tell patient she will need AFP 15-21 weeks to complete genetic testing .Routine prenatal labs needed. ? ?Covid Vaccine ?Patient has not covid vaccine.  ? ?Is patient a CenteringPregnancy candidate? Declined  ?  ?Is patient a Mom+Baby Combined Care candidate? Not a candidate   ? ? ?Informed patient of [facility] website  and placed link in her AVS.  ? ?Social Determinants of Health ?Food Insecurity: Patient denies food insecurity. ?WIC Referral: Patient is interested in referral to [facility].  ?Transportation: Patient denies transportation needs. ?Childcare: Discussed no children allowed at ultrasound appointments. Offered childcare services; patient declines childcare services at this time. ? ? ? ?Placed OB Box on problem list and updated ? ?First visit review ?I reviewed new OB appt with pt. I explained she will have a pelvic exam, ob bloodwork with genetic screening, and PAP smear. Explained pt will be seen by [facility] R. Walker CNM at first visit; encounter routed to appropriate provider. Explained that patient will be seen by pregnancy navigator following visit with provider. [facility] information placed in AVS.  ? ?Vidal Schwalbe, CMA ?05/02/2021  11:12 AM  ?

## 2021-05-14 ENCOUNTER — Encounter: Payer: Medicaid Other | Admitting: Certified Nurse Midwife

## 2021-05-23 ENCOUNTER — Encounter: Payer: Self-pay | Admitting: Family Medicine

## 2021-05-23 ENCOUNTER — Ambulatory Visit (INDEPENDENT_AMBULATORY_CARE_PROVIDER_SITE_OTHER): Payer: Medicaid Other | Admitting: Family Medicine

## 2021-05-23 VITALS — BP 132/83 | HR 74 | Wt 298.7 lb

## 2021-05-23 DIAGNOSIS — R110 Nausea: Secondary | ICD-10-CM

## 2021-05-23 DIAGNOSIS — Z641 Problems related to multiparity: Secondary | ICD-10-CM

## 2021-05-23 DIAGNOSIS — Z6841 Body Mass Index (BMI) 40.0 and over, adult: Secondary | ICD-10-CM

## 2021-05-23 DIAGNOSIS — Z3009 Encounter for other general counseling and advice on contraception: Secondary | ICD-10-CM

## 2021-05-23 DIAGNOSIS — O0990 Supervision of high risk pregnancy, unspecified, unspecified trimester: Secondary | ICD-10-CM

## 2021-05-23 MED ORDER — PROMETHAZINE HCL 25 MG PO TABS: 25.0000 mg | ORAL_TABLET | Freq: Four times a day (QID) | ORAL | 0 refills | Status: DC | PRN

## 2021-05-23 MED ORDER — CYCLOBENZAPRINE HCL 10 MG PO TABS: 10.0000 mg | ORAL_TABLET | Freq: Three times a day (TID) | ORAL | 0 refills | Status: DC | PRN

## 2021-05-23 MED ORDER — PRENATAL 27-1 MG PO TABS: 1.0000 | ORAL_TABLET | Freq: Every day | ORAL | 12 refills | Status: DC

## 2021-05-23 NOTE — Progress Notes (Addendum)
?  ? ?Subjective:  ? ?Brianna Stein is a 31 y.o. [id number] at 3w1dby 6wk [place] seen today for her first obstetrical visit.  Her obstetrical history is significant for obesity and grand multiparity and history of 36 week preterm delivery . Patient does not intend to breast feed. Pregnancy history fully reviewed. ? ?Patient reports  a lot of abdominal pain and nausea. Wondering about being written out from work at [employer]. ? ?HISTORY: ?OB History  ?Gravida Para Term Preterm AB Living  ?_0 ?SAB IAB Ectopic Multiple Live Births  ?1 0 0 0 6  ?  ?# Outcome Date GA Lbr Len/2nd Weight Sex Delivery Anes PTL Lv  ?8 Current           ?7 Preterm 04/27/19 334w0d0:22 / 00:07 6 lb 0.5 oz (2.736 kg) M Vag-Spont EPI  LIV  ?   Name: CAANALISSE, RANDLE?   Apgar1: 10  Apgar5: 10  ?6 Term 12/01/16 3824w0d lb (3.175 kg) M Vag-Spont EPI N LIV  ?5 Preterm 12/18/15 36w64w0dlb 6 oz (2.438 kg) F Vag-Spont EPI N LIV  ?4 Term 12/10/14 37w050w0db (3.175 kg) M Vag-Spont  N LIV  ?3 Term 06/24/13 45w0d9w0d 7 oz (2.92 kg) F Vag-Spont EPI N LIV  ?2 SAB 2015          ?1 Term 01/02/09 [redacted]w[redacted]d 69w0d8 oz (2.948 kg) F Vag-Spont  Y LIV  ?  ? ?Last pap smear: ?Lab Results  ?Component Value Date  ? DIAGPAP  11/06/2018  ?  - Negative for intraepithelial lesion or malignancy (NILM)  ? ? ? ?Past Medical History:  ?Diagnosis Date  ? Asthma   ? BMI 50.0-59.9, adult ([facility]) 04[place]/2021  ? ?Past Surgical History:  ?Procedure Laterality Date  ? NO PAST SURGERIES    ? ?History reviewed. No pertinent family history. ?Social History  ? ?Tobacco Use  ? Smoking status: Never  ? Smokeless tobacco: Never  ?Vaping Use  ? Vaping Use: Never used  ?Substance Use Topics  ? Alcohol use: Never  ? Drug use: Never  ? ?No Known Allergies ?Current Outpatient Medications on File Prior to Visit  ?Medication Sig Dispense Refill  ? Blood Pressure Monitoring (BLOOD PRESSURE KIT) DEVI 1 Device by Does not apply route as needed. 1 each 0  ? Misc. Devices (GOJJI WEIGHT  SCALE) MISC 1 Device by Does not apply route as needed. 1 each 0  ? ?No current facility-administered medications on file prior to visit.  ? ? ? ?Exam  ? ?Vitals:  ? 05/23/21 1443  ?BP: 132/83  ?Pulse: 74  ?Weight: 298 lb 11.2 oz (135.5 kg)  ? ?Fetal Heart Rate (bpm): 155 ? ?System: General: well-developed, well-nourished female in no acute distress  ? Skin: normal coloration and turgor, no rashes  ? Neurologic: oriented, normal, negative, normal mood  ? Extremities: normal strength, tone, and muscle mass, ROM of all joints is normal  ? HEENT PERRLA, extraocular movement intact and sclera clear, anicteric  ? Neck supple and no masses  ? Respiratory:  no respiratory distress  ? ? ?  ?Assessment:  ? ?Pregnancy: [id number] Active Problem List  ? Diagnosis Date Noted  ? Unwanted fertility 05/23/2021  ? Supervision of high risk pregnancy, antepartum 05/02/2021  ? Cutaneous candidiasis 10/04/2020  ? Grand mBronsonarity 04/30/2019  ? BMI 50.0-59.9, adult ([facility]) 04[place]/2021  ? Preterm  labor 04/27/2019  ? ?  ?Plan:  ?1. Supervision of high risk pregnancy, antepartum ?BP and FHR normal ?Up to date on pap ?Discussed that many symptoms are unlikely to be due to pregnancy but can definitely do a work restriction letter ?Also has some paperwork she'd like [place] to evaluate, she will upload through mychart ? ?2. [place] multiparity ?NSVD x6 previously ? ?3. BMI 50.0-59.9, adult ([place]) ? ? ?4. Unwanted fertility ?Strongly desires BTL, reviewed signing papers at 28 wks ?Discussed given BMI will need post partum laparoscopic tubal, she is OK with this ?Also interested in post partum IUD for added protection ? ? ?Initial labs drawn. ?Continue prenatal vitamins. ?Genetic Screening discussed, NIPS: ordered. ?Ultrasound discussed; fetal anatomic survey: ordered. ?Problem list reviewed and updated. ?The nature of [place] with multiple MDs and other Advanced Practice Providers was explained to patient;  also emphasized that residents, students are part of our team. ?Routine obstetric precautions reviewed. ?Return in about 4 weeks (around 06/20/2021) for [facility], ob visit. ? ?  ? ?

## 2021-05-23 NOTE — Patient Instructions (Signed)
AREA PEDIATRIC/FAMILY PRACTICE PHYSICIANS ? ?[place] ([address]) ?[facility] ?Chambliss, MD; Eniola, MD; Hale, MD; Hensel, MD; McDiarmid, MD; McIntyer, MD; Neal, MD; Walden, MD ?[address] [place], [place] [address] ?[phone] ?Mon-Fri 8:30-12:30, 1:30-5:00 ?Providers come to see babies at [facility] ?Accepting Medicaid ?[facility] ?Limited providers who accept newborns: Koirala, MD; Morrow, MD; Wolters, MD ?[address], [place], [place] [address] ?[phone] ?Mon-Fri 8:00-5:30 ?Babies seen by providers at [facility] ?Does NOT accept Medicaid ?Please call early in hospitalization for appointment (limited availability)  ?[facility] ?Mulberry, MD ?[address] [place], [place] [address] ?[phone] ?Mon, Tue, Thur, Fri 8:30-5:00, Wed 10:00-7:00 (closed 1-2pm) ?Babies seen by [facility] providers ?Accepting Medicaid ?[facility] - Pediatrician ?Rubin, MD ?[address], [place], [place] [address] ?[phone] ?Mon-Fri 8:30-5:00, Sat 8:30-12:00 ?Provider comes to see babies at [facility] ?Accepting Medicaid ?Must have been referred from current patients or contacted office prior to delivery ?[facility] ([facility]) ?Brown, MD; Chandler, MD; Ettefagh, MD; Grant, MD; Lester, MD; McCormick, MD; McQueen, MD; Prose, MD; Simha, MD; Stanley, MD; Stryffeler, NP; Tebben, NP ?[address], [place], [place] [address] ?[phone] ?Mon, Tue, Thur, Fri 8:30-5:30, Wed 9:30-5:30, Sat 8:30-12:30 ?Babies seen by [facility] providers ?Accepting Medicaid ?Only accepting infants of first-time parents or siblings of current patients ?Hospital discharge coordinator will make follow-up appointment ?Jack Amos ?[address], [place], [place]  [address] ?[phone]   Fax - [phone] ?[facility] ?[address], [place], [place]  [address] ?Phone - [phone]   Fax - [phone] ?Shilpa Gosrani ?[address], [place], [place]  [address] ?[phone] ? ?[place] ([address]) ?[facility] ?Bates, MD; [place]assfield, MD; Cooper, Cox, MD; MD; Davis, MD; Dovico, MD; Ettefaugh, MD; Little, MD; Lowe, MD; Keiffer, MD; Melvin, MD; Sumner, MD; Williams, MD ?[address], [place], [place] [address] ?[phone] ?Mon-Fri 8:30-5:00 (extended evenings Mon-Thur as needed), Sat-Sun 10:00-1:00 ?Providers come to see babies at [facility] ?Accepting Medicaid for families of first-time babies and families with all children in the household age 3 and under. Must register with office prior to making appointment (M-F only). ?[facility] ?Henson, NP; Knapp, MD; Lalonde, MD; Tysinger, PA ?[address] [place], [place] [address] ?[phone] ?Mon-Fri 8:00-5:00 ?Babies seen by providers at [facility] ?Does NOT accept Medicaid/Commercial Insurance Only ?[facility] ([facility])  ?Artis, MD; Barnes, MD; Bratton, MD; Coccaro, MD; Lockett Gardner, MD; Kramer, MD; Marshall, MD; Netherton, MD; Poleto, MD; Skinner, MD ?[address] [place], [place] [address] ?[phone] ?Mon-Fri 8:30-5:30, Sat (Oct.-Mar.) 9:00-1:00 ?Babies seen by providers at [facility] ?Accepting Medicaid ? ?[place] ([address]) ?[facility] ?Reid, MD; Warner, MD ?[address], [place], [place] [address] ?[phone] ?Mon-Fri 8:30-5:00, Sat 8:30-12:00 ?Providers come to see babies at [facility] ?Does NOT accept Medicaid ?[facility] ?Becker, PA; Hagler, MD; Scifres, PA; Sun, MD; Swayne, MD ?[address], [place], [place] [address] ?[phone] ?Mon-Fri 8:00-5:00 ?Babies seen by providers at [facility] ?Does NOT accept Medicaid ?Only accepting babies of parents who  are patients ?Please call early in hospitalization for appointment (limited availability) ? [facility] ?Clark, MD; Frye, MD; Kelleher, MD; Mack, NP; Miller, MD; O'Keller, MD; Patterson, NP; Pudlo, MD; Puzio, MD; Thomas, MD; Tucker, MD; Twiselton, MD ?[address], [place], [place] [address] ?[phone] ?Mon-Fri 8:00-5:00, Sat 9:00-12:00 ?Providers come to see babies at [facility] ?Does NOT accept Medicaid ? ?[place] ([address]) ?[facility] ?Limited providers accepting new patients: Brake, NP; Wharton, PA ?[address], [place], [place] [address] ?[phone] ?Mon-Fri 8:00-5:00 ?Babies seen by providers at [facility] ?Does NOT accept Medicaid ?Only accepting babies of parents who are patients ?Please call early in hospitalization for appointment (limited availability) ?[facility] ?Gay, MD; Quinlan, MD ?[address] [place], [place] [address] ?[phone] (press [phone] to schedule appointment) ?Mon-Fri 8:00-5:00 ?Providers come to see babies at [facility] ?Does NOT accept Medicaid ?[facility] ?Mazer, MD ?[address] [place], [place] [address] ?[phone] ?Mon-Fri 8:30-5:00 (lunch 12:30-1:00), extended hours by appointment only Wed 5:00-6:30 ?Babies seen by [facility] providers ?Accepting Medicaid ?[facility] ?Banks, MD; Jordan, MD; Koberlein, MD ?[address], [place], [place] [address] ?[phone] ?Mon-Fri 8:00-5:00 ?Babies seen by [facility] providers ?Does NOT accept Medicaid ?[facility] ?Parker, MD; Hunter, MD; Wallace, DO ?[address] [place], [place] [address] ?[phone] ?Mon-Fri 8:00-5:00 ?Babies seen by [facility] providers ?Does NOT accept Medicaid ?[facility] ?Brandon, PA; Brecken, PA; Christy, NP; Dees, MD; DeClaire, MD; DeWeese, MD; Hansen, NP; Mills, NP; Parrish, NP; Smoot, NP; Summer, MD; Vapne,  MD ?[address] [place], [place] [address] ?[phone] ?Mon-Fri 8:30-5:00, Sat 10:00-1:00 ?Providers come to see babies at [facility] ?Does NOT accept Medicaid ?Free prenatal information session Tuesdays at 4:45pm ?[facility] ?Bouska, MD; Gordon, PA; Jeffery, PA; Weber, PA ?[address] [place] [address] ?[phone] ?Mon-Fri 7:30-5:30 ?Babies seen by [facility] providers ?[facility] ?[address], [place], [place]  [address] ?[phone]   Fax - [phone] ? ?[place] ([address]) ?[facility] ?Reese, MD ?[address] [place], [place] [address] ?[phone] ?Mon-Thur 8:00-6:00 ?Providers come to see babies at [facility] ?Accepting Medicaid ?[facility] ?Anderson, NP; Badger, MD; Beal, PA; Spencer, PA ?[address] [place], [place] [address] ?[phone] ?Mon-Thur 7:30-7:30, Fri 7:30-4:30 ?Babies seen by [facility] providers ?Accepting Medicaid ?[facility] ?Agbuya, MD; Klett, NP; Romgoolam, MD ?[address], [place], [place] [address] ?[phone] ?Mon-Fri 8:30-5:00, Sat 8:30-12:00 ?Providers come to see babies at [facility] ?Accepting Medicaid ?Must have ?Meet & Greet? appointment at office prior to delivery ?[facility] ([facility]) ?McCord, MD; Wallace, MD; Wood, MD ?[address], [place], [place] [address] ?[phone] ?Mon-Wed 8:00-6:00, Thur-Fri 8:00-5:00, Sat 9:00-12:00 ?Providers come to see babies at [facility] ?Does NOT accept Medicaid ?Only accepting siblings of current patients ?[facility]  ?[address], [place], [place]  [address] ?[phone]   Fax - [phone] ?[facility] ?[address], [place],   [address] ?[phone]   Fax -  [phone] ? ?[place] ([address]) ?[facility] ?Cirigliano, DO; Matthews, DO ?[address] ,  [address] ?[phone] ?Mon-Fri 7:00-5:00 ?Babies seen by [facility]

## 2021-05-23 NOTE — Addendum Note (Signed)
Addended by: Merian Capron on: 05/23/2021 03:27 PM ? ? Modules accepted: Orders ? ?

## 2021-05-24 LAB — CBC/D/PLT+RPR+RH+ABO+RUBIGG...
Antibody Screen: NEGATIVE
Basophils Absolute: 0 10*3/uL (ref 0.0–0.2)
Basos: 0 %
EOS (ABSOLUTE): 0.2 10*3/uL (ref 0.0–0.4)
Eos: 2 %
HCV Ab: NONREACTIVE
HIV Screen 4th Generation wRfx: NONREACTIVE
Hematocrit: 36.6 % (ref 34.0–46.6)
Hemoglobin: 11.9 g/dL (ref 11.1–15.9)
Hepatitis B Surface Ag: NEGATIVE
Immature Grans (Abs): 0 10*3/uL (ref 0.0–0.1)
Immature Granulocytes: 0 %
Lymphocytes Absolute: 2 10*3/uL (ref 0.7–3.1)
Lymphs: 21 %
MCH: 26.4 pg — ABNORMAL LOW (ref 26.6–33.0)
MCHC: 32.5 g/dL (ref 31.5–35.7)
MCV: 81 fL (ref 79–97)
Monocytes Absolute: 0.7 10*3/uL (ref 0.1–0.9)
Monocytes: 7 %
Neutrophils Absolute: 6.5 10*3/uL (ref 1.4–7.0)
Neutrophils: 70 %
Platelets: 269 10*3/uL (ref 150–450)
RBC: 4.51 x10E6/uL (ref 3.77–5.28)
RDW: 14.4 % (ref 11.7–15.4)
RPR Ser Ql: NONREACTIVE
Rh Factor: POSITIVE
Rubella Antibodies, IGG: 3.74 index (ref >0.99)
WBC: 9.4 10*3/uL (ref 3.4–10.8)

## 2021-05-24 LAB — HEMOGLOBIN A1C
Est. average glucose Bld gHb Est-mCnc: 108 mg/dL
Hgb A1c MFr Bld: 5.4 % (ref 4.8–5.6)

## 2021-05-24 LAB — HCV INTERPRETATION

## 2021-05-27 LAB — CULTURE, OB URINE

## 2021-05-27 LAB — URINE CULTURE, OB REFLEX

## 2021-05-28 ENCOUNTER — Encounter: Payer: Self-pay | Admitting: Family Medicine

## 2021-05-28 DIAGNOSIS — R8271 Bacteriuria: Secondary | ICD-10-CM | POA: Insufficient documentation

## 2021-06-06 ENCOUNTER — Encounter: Payer: Self-pay | Admitting: *Deleted

## 2021-06-11 ENCOUNTER — Encounter: Payer: Self-pay | Admitting: Family Medicine

## 2021-06-11 DIAGNOSIS — O219 Vomiting of pregnancy, unspecified: Secondary | ICD-10-CM

## 2021-06-12 MED ORDER — ONDANSETRON HCL 4 MG PO TABS: 4.0000 mg | ORAL_TABLET | Freq: Three times a day (TID) | ORAL | 0 refills | Status: DC | PRN

## 2021-06-19 ENCOUNTER — Ambulatory Visit (INDEPENDENT_AMBULATORY_CARE_PROVIDER_SITE_OTHER): Payer: Medicaid Other | Admitting: Family Medicine

## 2021-06-19 VITALS — BP 109/66 | HR 79 | Wt 294.1 lb

## 2021-06-19 DIAGNOSIS — R5383 Other fatigue: Secondary | ICD-10-CM

## 2021-06-19 DIAGNOSIS — R252 Cramp and spasm: Secondary | ICD-10-CM

## 2021-06-19 DIAGNOSIS — O0990 Supervision of high risk pregnancy, unspecified, unspecified trimester: Secondary | ICD-10-CM

## 2021-06-19 DIAGNOSIS — O219 Vomiting of pregnancy, unspecified: Secondary | ICD-10-CM

## 2021-06-19 DIAGNOSIS — Z6841 Body Mass Index (BMI) 40.0 and over, adult: Secondary | ICD-10-CM

## 2021-06-19 DIAGNOSIS — Z641 Problems related to multiparity: Secondary | ICD-10-CM

## 2021-06-19 DIAGNOSIS — Z3A16 16 weeks gestation of pregnancy: Secondary | ICD-10-CM

## 2021-06-19 MED ORDER — CYCLOBENZAPRINE HCL 10 MG PO TABS: 10.0000 mg | ORAL_TABLET | Freq: Two times a day (BID) | ORAL | 0 refills | Status: DC | PRN

## 2021-06-19 MED ORDER — ONDANSETRON HCL 4 MG PO TABS: 4.0000 mg | ORAL_TABLET | Freq: Three times a day (TID) | ORAL | 0 refills | Status: DC | PRN

## 2021-06-19 NOTE — Progress Notes (Signed)
    Subjective:  Brianna Stein is a 31 y.o. [id number] at [redacted]w[redacted]d being seen today for ongoing prenatal care.  She is currently monitored for the following issues for this low-risk pregnancy and has Preterm labor; BMI 50.0-59.9, adult ([place]); [place] multiparity; Cutaneous candidiasis; Supervision of high risk pregnancy, antepartum; Unwanted fertility; and GBS bacteriuria on their problem list.  Patient reports feeling nauseous and fatigued/exhausted/generally weak by the end of the day. She hasn't been eating or drinking as much as usual due to the nausea, but can not take the phenergan because it makes her drowsy and needs to take care of her 6 children at home. She had a zofran rx sent to her pharmacy but never picked it up because she was told it would make her constipated.   Contractions: Not present. Vag. Bleeding: None.  Movement: Absent. Denies leaking of fluid.   The following portions of the patient's history were reviewed and updated as appropriate: allergies, current medications, past family history, past medical history, past social history, past surgical history and problem list.   Objective:   Vitals:   06/19/21 1323  BP: 109/66  Pulse: 79  Weight: 294 lb 1.6 oz (133.4 kg)    Fetal Status: Fetal Heart Rate (bpm): 151   General:  Alert, oriented and cooperative. Patient is in no acute distress.  Skin: Skin is warm and dry. No rash noted.   Cardiovascular: Normal heart rate noted  Respiratory: Normal respiratory effort, no problems with respiration noted  Abdomen: Soft, gravid, appropriate for gestational age. Pain/Pressure: Present     Pelvic:  Cervical exam deferred        Extremities: Normal range of motion.  Edema: None  Mental Status: Normal mood and affect. Normal behavior. Normal judgment and thought content.    Assessment and Plan:  Pregnancy: [id number] at [redacted]w[redacted]d  1. Supervision of high risk pregnancy, antepartum Doing well.   2. [redacted] weeks gestation of pregnancy Anatomy  scheduled for 6/20.   3. BMI 50.0-59.9, adult ([place]) Plans to have growth [place] with MFM and antenatal testing starting at 34 weeks.   4. Grand multiparity Desires interval BTL.   5. Other fatigue Discussed besides fatigue associated with early pregnancy, hers likely also roots from improper nutrition/hydration due to nausea. Discussed importance of using zofran to improve nausea and subsequent eating/drinking habits and treating any concurrent constipation with fiber rich foods/miralax. This should be a temporary measure as hopefully her nausea should improve as she gets further into her 2nd trimester.   Preterm labor symptoms and general obstetric precautions including but not limited to vaginal bleeding, contractions, leaking of fluid and fetal movement were reviewed in detail with the patient. Please refer to After Visit Summary for other counseling recommendations.   Return in about 4 weeks (around 07/17/2021) for [place].  Patriciaann Clan, DO

## 2021-06-19 NOTE — Patient Instructions (Signed)
Safe Medications in Pregnancy   Constipation: Colace Ducolax suppositories Fleet enema Glycerin suppositories Metamucil Milk of magnesia Miralax Senokot Smooth move tea  Diarrhea: Kaopectate Imodium A-D  *NO pepto Bismol  Nausea/Vomiting:  Bonine Dramamine Emetrol Ginger extract Sea bands Meclizine  Nausea medication to take during pregnancy:  *Vitamin B6 100mg  tablets. Take one tablet twice a day (up to 200 mg per day).

## 2021-06-21 LAB — AFP, SERUM, OPEN SPINA BIFIDA
AFP MoM: 0.45
AFP Value: 11.5 ng/mL
Gest. Age on Collection Date: 16 weeks
Maternal Age At EDD: 29.8 yr
OSBR Risk 1 IN: 10000
Test Results:: NEGATIVE
Weight: 294 [lb_av]

## 2021-07-08 ENCOUNTER — Encounter: Payer: Self-pay | Admitting: Family Medicine

## 2021-07-10 ENCOUNTER — Ambulatory Visit (HOSPITAL_BASED_OUTPATIENT_CLINIC_OR_DEPARTMENT_OTHER): Payer: Medicaid Other

## 2021-07-10 ENCOUNTER — Ambulatory Visit: Payer: Medicaid Other

## 2021-07-10 ENCOUNTER — Encounter: Payer: Self-pay | Admitting: *Deleted

## 2021-07-10 ENCOUNTER — Ambulatory Visit: Payer: Medicaid Other | Attending: Certified Nurse Midwife | Admitting: *Deleted

## 2021-07-10 ENCOUNTER — Other Ambulatory Visit: Payer: Self-pay | Admitting: *Deleted

## 2021-07-10 VITALS — BP 119/68 | HR 102

## 2021-07-10 DIAGNOSIS — O0942 Supervision of pregnancy with grand multiparity, second trimester: Secondary | ICD-10-CM | POA: Insufficient documentation

## 2021-07-10 DIAGNOSIS — O09212 Supervision of pregnancy with history of pre-term labor, second trimester: Secondary | ICD-10-CM | POA: Diagnosis not present

## 2021-07-10 DIAGNOSIS — O0990 Supervision of high risk pregnancy, unspecified, unspecified trimester: Secondary | ICD-10-CM

## 2021-07-10 DIAGNOSIS — O99212 Obesity complicating pregnancy, second trimester: Secondary | ICD-10-CM

## 2021-07-10 DIAGNOSIS — O09892 Supervision of other high risk pregnancies, second trimester: Secondary | ICD-10-CM

## 2021-07-10 DIAGNOSIS — Z3A19 19 weeks gestation of pregnancy: Secondary | ICD-10-CM | POA: Diagnosis not present

## 2021-07-10 DIAGNOSIS — Z362 Encounter for other antenatal screening follow-up: Secondary | ICD-10-CM

## 2021-07-17 ENCOUNTER — Other Ambulatory Visit: Payer: Self-pay

## 2021-07-17 ENCOUNTER — Encounter: Payer: Self-pay | Admitting: Family Medicine

## 2021-07-17 ENCOUNTER — Ambulatory Visit (INDEPENDENT_AMBULATORY_CARE_PROVIDER_SITE_OTHER): Payer: Medicaid Other | Admitting: Family Medicine

## 2021-07-17 VITALS — BP 98/70 | HR 98 | Wt 294.8 lb

## 2021-07-17 DIAGNOSIS — Z6841 Body Mass Index (BMI) 40.0 and over, adult: Secondary | ICD-10-CM

## 2021-07-17 DIAGNOSIS — Z3009 Encounter for other general counseling and advice on contraception: Secondary | ICD-10-CM

## 2021-07-17 DIAGNOSIS — Z641 Problems related to multiparity: Secondary | ICD-10-CM

## 2021-07-17 DIAGNOSIS — O0990 Supervision of high risk pregnancy, unspecified, unspecified trimester: Secondary | ICD-10-CM

## 2021-07-17 DIAGNOSIS — R8271 Bacteriuria: Secondary | ICD-10-CM

## 2021-07-30 ENCOUNTER — Encounter: Payer: Self-pay | Admitting: Family Medicine

## 2021-08-04 ENCOUNTER — Encounter: Payer: Self-pay | Admitting: Student

## 2021-08-13 ENCOUNTER — Ambulatory Visit: Payer: Medicaid Other | Admitting: *Deleted

## 2021-08-13 ENCOUNTER — Encounter: Payer: Self-pay | Admitting: *Deleted

## 2021-08-13 ENCOUNTER — Ambulatory Visit: Payer: Medicaid Other | Attending: Obstetrics and Gynecology

## 2021-08-13 ENCOUNTER — Other Ambulatory Visit: Payer: Self-pay | Admitting: *Deleted

## 2021-08-13 VITALS — BP 110/66 | HR 83

## 2021-08-13 DIAGNOSIS — O0990 Supervision of high risk pregnancy, unspecified, unspecified trimester: Secondary | ICD-10-CM | POA: Insufficient documentation

## 2021-08-13 DIAGNOSIS — Z362 Encounter for other antenatal screening follow-up: Secondary | ICD-10-CM | POA: Diagnosis present

## 2021-08-13 DIAGNOSIS — O358XX0 Maternal care for other (suspected) fetal abnormality and damage, not applicable or unspecified: Secondary | ICD-10-CM | POA: Diagnosis not present

## 2021-08-13 DIAGNOSIS — J45909 Unspecified asthma, uncomplicated: Secondary | ICD-10-CM

## 2021-08-13 DIAGNOSIS — Z3A23 23 weeks gestation of pregnancy: Secondary | ICD-10-CM

## 2021-08-13 DIAGNOSIS — Z6841 Body Mass Index (BMI) 40.0 and over, adult: Secondary | ICD-10-CM

## 2021-08-13 DIAGNOSIS — O09899 Supervision of other high risk pregnancies, unspecified trimester: Secondary | ICD-10-CM

## 2021-08-13 DIAGNOSIS — O0942 Supervision of pregnancy with grand multiparity, second trimester: Secondary | ICD-10-CM | POA: Insufficient documentation

## 2021-08-13 DIAGNOSIS — O09892 Supervision of other high risk pregnancies, second trimester: Secondary | ICD-10-CM | POA: Diagnosis present

## 2021-08-13 DIAGNOSIS — O09212 Supervision of pregnancy with history of pre-term labor, second trimester: Secondary | ICD-10-CM

## 2021-08-13 DIAGNOSIS — O99512 Diseases of the respiratory system complicating pregnancy, second trimester: Secondary | ICD-10-CM

## 2021-08-13 DIAGNOSIS — O99212 Obesity complicating pregnancy, second trimester: Secondary | ICD-10-CM | POA: Diagnosis present

## 2021-08-13 DIAGNOSIS — O368990 Maternal care for other specified fetal problems, unspecified trimester, not applicable or unspecified: Secondary | ICD-10-CM

## 2021-08-13 DIAGNOSIS — E669 Obesity, unspecified: Secondary | ICD-10-CM

## 2021-08-15 NOTE — Progress Notes (Deleted)
   PRENATAL VISIT NOTE  Subjective:  Brianna Stein is a 31 y.o. [phone] at [redacted]w[redacted]d being seen today for ongoing prenatal care.  She is currently monitored for the following issues for this {Blank single:19197::"high-risk","low-risk"} pregnancy and has Preterm labor; BMI 50.0-59.9, adult ([facility]); Grand multiparity; Cutaneous candidiasis; Supervision of high risk pregnancy, antepartum; Unwanted fertility; and GBS bacteriuria on their problem list.  Patient reports {sx:[id number]}.   .  .   . Denies leaking of fluid.   The following portions of the patient's history were reviewed and updated as appropriate: allergies, current medications, past family history, past medical history, past social history, past surgical history and problem list.   Objective:  There were no vitals filed for this visit.  Fetal Status:           General:  Alert, oriented and cooperative. Patient is in no acute distress.  Skin: Skin is warm and dry. No rash noted.   Cardiovascular: Normal heart rate noted  Respiratory: Normal respiratory effort, no problems with respiration noted  Abdomen: Soft, gravid, appropriate for gestational age.        Pelvic: {Blank single:19197::"Cervical exam performed in the presence of a chaperone","Cervical exam deferred"}        Extremities: Normal range of motion.     Mental Status: Normal mood and affect. Normal behavior. Normal judgment and thought content.   Assessment and Plan:  Pregnancy: [id number] at [redacted]w[redacted]d There are no diagnoses linked to this encounter. {Blank single:19197::"Term","Preterm"} labor symptoms and general obstetric precautions including but not limited to vaginal bleeding, contractions, leaking of fluid and fetal movement were reviewed in detail with the patient. Please refer to After Visit Summary for other counseling recommendations.   No follow-ups on file.  Future Appointments  Date Time Provider Department Center  08/16/2021  1:15 PM Madlyn Frankel  [facility]  09/10/2021  3:30 PM [facility] NURSE [facility]  09/10/2021  3:45 PM [facility] US5 [facility]    Samara Deist Gena Fray, CNM

## 2021-08-16 ENCOUNTER — Encounter: Payer: Medicaid Other | Admitting: Student

## 2021-08-17 ENCOUNTER — Encounter: Payer: Self-pay | Admitting: Family Medicine

## 2021-08-17 ENCOUNTER — Ambulatory Visit (INDEPENDENT_AMBULATORY_CARE_PROVIDER_SITE_OTHER): Payer: Medicaid Other | Admitting: Family Medicine

## 2021-08-17 ENCOUNTER — Other Ambulatory Visit (HOSPITAL_COMMUNITY)
Admission: RE | Admit: 2021-08-17 | Discharge: 2021-08-17 | Disposition: A | Discharge status: Home / Self Care | Payer: Medicaid Other | Source: Ambulatory Visit | Attending: Student | Admitting: Student

## 2021-08-17 ENCOUNTER — Other Ambulatory Visit: Payer: Self-pay

## 2021-08-17 VITALS — BP 111/72 | HR 102 | Wt 293.4 lb

## 2021-08-17 DIAGNOSIS — R102 Pelvic and perineal pain: Secondary | ICD-10-CM

## 2021-08-17 DIAGNOSIS — Z3A24 24 weeks gestation of pregnancy: Secondary | ICD-10-CM

## 2021-08-17 DIAGNOSIS — O0990 Supervision of high risk pregnancy, unspecified, unspecified trimester: Secondary | ICD-10-CM

## 2021-08-17 DIAGNOSIS — Z641 Problems related to multiparity: Secondary | ICD-10-CM | POA: Diagnosis not present

## 2021-08-17 DIAGNOSIS — Z6841 Body Mass Index (BMI) 40.0 and over, adult: Secondary | ICD-10-CM

## 2021-08-17 DIAGNOSIS — R300 Dysuria: Secondary | ICD-10-CM

## 2021-08-17 LAB — POCT URINALYSIS DIP (DEVICE)
Bilirubin Urine: NEGATIVE
Glucose, UA: 100 mg/dL — AB
Hgb urine dipstick: NEGATIVE
Ketones, ur: NEGATIVE mg/dL
Nitrite: NEGATIVE
Protein, ur: 30 mg/dL — AB
Specific Gravity, Urine: >=1.03 (ref 1.005–1.030)
Urobilinogen, UA: 1 mg/dL (ref 0.0–1.0)
pH: 6 (ref 5.0–8.0)

## 2021-08-17 LAB — OB RESULTS CONSOLE GBS: GBS: POSITIVE

## 2021-08-17 NOTE — Addendum Note (Signed)
Addended by: Celedonio Savage on: 08/17/2021 11:36 AM   Modules accepted: Orders

## 2021-08-17 NOTE — Progress Notes (Signed)
   PRENATAL VISIT NOTE  Subjective:  Brianna Stein is a 31 y.o. [phone] at [redacted]w[redacted]d being seen today for ongoing prenatal care.  She is currently monitored for the following issues for this high-risk pregnancy and has Preterm labor; BMI 50.0-59.9, adult ([facility]); [place] multiparity; Cutaneous candidiasis; Supervision of high risk pregnancy, antepartum; Unwanted fertility; and GBS bacteriuria on their problem list.  Patient reports no contractions, no leaking, and spotting a few days ago and continued vaginal pain .  Contractions: Not present. Vag. Bleeding: None.  Movement: Present. Denies leaking of fluid.   The following portions of the patient's history were reviewed and updated as appropriate: allergies, current medications, past family history, past medical history, past social history, past surgical history and problem list.   Objective:   Vitals:   08/17/21 1101  BP: 111/72  Pulse: (!) 102  Weight: 293 lb 6.4 oz (133.1 kg)    Fetal Status: Fetal Heart Rate (bpm): 151   Movement: Present     General:  Alert, oriented and cooperative. Patient is in no acute distress.  Skin: Skin is warm and dry. No rash noted.   Cardiovascular: Normal heart rate noted  Respiratory: Normal respiratory effort, no problems with respiration noted  Abdomen: Soft, gravid, appropriate for gestational age.  Pain/Pressure: Present     Pelvic: Normal external vaginal tissue, normal internal vaginal tissue with moderate amount of white thick discharge.  No cervical friability appreciated         Extremities: Normal range of motion.     Mental Status: Normal mood and affect. Normal behavior. Normal judgment and thought content.   Assessment and Plan:  Pregnancy: [id number] at [redacted]w[redacted]d 1. Supervision of high risk pregnancy, antepartum Doing well, will have 28-week visit at next visit we will have 2-hour GTT as well as 28-week OB labs.  2. Grand multiparity 6 prior NSVD.  Desires BTL.  Plan on interval BTL.  Post  placental IUD as contraception between.  3. BMI 50.0-59.9, adult [facility]) Following with MFM.  Growth ultrasound and antenatal testing starting at 34 weeks  4. [redacted] weeks gestation of pregnancy  5. Vaginal pain Describes pain as sharp pain in the vagina worse with urination.  No costovertebral angle tenderness.  Urinalysis collected today as well as wet prep with STD testing. - Cervicovaginal ancillary only( [place])  6. GBS Bacteriuria  Plan for PPx in labor  7.  Preterm labor in the third trimester preterm delivery Delivery was at 35 weeks.  Preterm labor symptoms and general obstetric precautions including but not limited to vaginal bleeding, contractions, leaking of fluid and fetal movement were reviewed in detail with the patient. Please refer to After Visit Summary for other counseling recommendations.   Return in about 4 weeks (around 09/14/2021) for 28 wk OB visit .  Future Appointments  Date Time Provider Department Center  09/10/2021  3:30 PM [facility] NURSE [facility]  09/10/2021  3:45 PM [facility] US5 [facility]    Celedonio Savage, MD

## 2021-08-20 ENCOUNTER — Encounter: Payer: Self-pay | Admitting: Family Medicine

## 2021-08-20 LAB — CERVICOVAGINAL ANCILLARY ONLY
Bacterial Vaginitis (gardnerella): POSITIVE — AB
Candida Glabrata: NEGATIVE
Candida Vaginitis: POSITIVE — AB
Chlamydia: NEGATIVE
Comment: NEGATIVE
Comment: NEGATIVE
Comment: NEGATIVE
Comment: NEGATIVE
Comment: NEGATIVE
Comment: NORMAL
Neisseria Gonorrhea: NEGATIVE
Trichomonas: NEGATIVE

## 2021-08-20 MED ORDER — TERCONAZOLE 0.4 % VA CREA: 1.0000 | TOPICAL_CREAM | Freq: Every day | VAGINAL | 0 refills | Status: DC

## 2021-08-20 MED ORDER — TINIDAZOLE 500 MG PO TABS: 2.0000 g | ORAL_TABLET | Freq: Every day | ORAL | 0 refills | Status: DC

## 2021-08-20 NOTE — Addendum Note (Signed)
Addended by: Isabell Jarvis on: 08/20/2021 04:53 PM   Modules accepted: Orders

## 2021-08-21 ENCOUNTER — Telehealth: Payer: Self-pay | Admitting: General Practice

## 2021-08-21 ENCOUNTER — Inpatient Hospital Stay (HOSPITAL_COMMUNITY)
Admission: AD | Admit: 2021-08-21 | Discharge: 2021-08-21 | Disposition: A | Discharge status: Home / Self Care | Payer: Medicaid Other | Attending: Obstetrics & Gynecology | Admitting: Obstetrics & Gynecology

## 2021-08-21 ENCOUNTER — Encounter (HOSPITAL_COMMUNITY): Payer: Self-pay | Admitting: Obstetrics & Gynecology

## 2021-08-21 DIAGNOSIS — Z3A25 25 weeks gestation of pregnancy: Secondary | ICD-10-CM | POA: Diagnosis not present

## 2021-08-21 DIAGNOSIS — O26892 Other specified pregnancy related conditions, second trimester: Secondary | ICD-10-CM | POA: Diagnosis present

## 2021-08-21 DIAGNOSIS — B9689 Other specified bacterial agents as the cause of diseases classified elsewhere: Secondary | ICD-10-CM

## 2021-08-21 DIAGNOSIS — O23592 Infection of other part of genital tract in pregnancy, second trimester: Secondary | ICD-10-CM | POA: Diagnosis not present

## 2021-08-21 DIAGNOSIS — N760 Acute vaginitis: Secondary | ICD-10-CM

## 2021-08-21 DIAGNOSIS — Z3689 Encounter for other specified antenatal screening: Secondary | ICD-10-CM

## 2021-08-21 LAB — URINE CULTURE, OB REFLEX

## 2021-08-21 LAB — CULTURE, OB URINE

## 2021-08-21 MED ORDER — TERCONAZOLE 0.4 % VA CREA: 1.0000 | TOPICAL_CREAM | Freq: Every day | VAGINAL | 0 refills | Status: AC

## 2021-08-21 MED ORDER — METRONIDAZOLE 500 MG PO TABS: 500.0000 mg | ORAL_TABLET | Freq: Two times a day (BID) | ORAL | 0 refills | Status: DC

## 2021-08-21 NOTE — Telephone Encounter (Signed)
Called patient due to mychart message reporting pain. Patient states she is trying to get the antibiotic because she is having severe pain in her vagina. Patient states the pain got worse yesterday and feels like she cannot even sit or walk due to it. She reports the pain as 10/10 and feels sharp/stabbing. She states she also feels like something is bulging and there is a pressure. Her partner told her, her vulva is really swollen and looks red. Patient denies discharge or itching. Discussed with patient going to MAU for further evaluation. Told patient yeast or BV wouldn't cause the symptoms she is experiencing. Informed her of antibiotic changed to something covered by insurance. Patient verbalized understanding & had no questions.

## 2021-08-21 NOTE — MAU Provider Note (Signed)
History     CSN: [id number]  Arrival date and time: 08/21/21 1701   Event Date/Time   First Provider Initiated Contact with Patient 08/21/21 1809      Chief Complaint  Patient presents with   Vaginal Pain   Brianna Stein is a 31 y.o. [id number] at [redacted]w[redacted]d who receives care at [facility].  She presents today for Vaginal Pain.  She states around July 15th she started having pain with intercourse that improved after a few days. She reports she was checked for infections a few days ago and has been having pain ever since.  She reports her husband looked and told her that it was "swollen down there."  She reports she called the office and was told to come in because "it could be a cyst inside and something about a blockage."  She states the pain is worsened with "everything that got to do with touching down there." She denies relieving factors and rates the pain a 10/10. Patient endorses fetal movement and denies abdominal pain or cramping. She endorses having known yeast and BV and states she tried to pick up her medication and was told she needed prior authorization.     OB History     Gravida  8   Para  6   Term  4   Preterm  2   AB  1   Living  6      SAB  1   IAB  0   Ectopic  0   Multiple  0   Live Births  6           Past Medical History:  Diagnosis Date   Asthma    BMI 50.0-59.9, adult ([facility]) 04/28/2019    Past Surgical History:  Procedure Laterality Date   NO PAST SURGERIES      History reviewed. No pertinent family history.  Social History   Tobacco Use   Smoking status: Never   Smokeless tobacco: Never  Vaping Use   Vaping Use: Never used  Substance Use Topics   Alcohol use: Never   Drug use: Never    Allergies: No Known Allergies  Medications Prior to Admission  Medication Sig Dispense Refill Last Dose   cyclobenzaprine (FLEXERIL) 10 MG tablet Take 1 tablet (10 mg total) by mouth 2 (two) times daily as needed for muscle spasms. 30 tablet 0  08/20/2021   metroNIDAZOLE (FLAGYL) 500 MG tablet Take 1 tablet (500 mg total) by mouth 2 (two) times daily. 14 tablet 0    Prenatal 27-1 MG TABS Take 1 tablet by mouth daily. (Patient not taking: Reported on 08/17/2021) 30 tablet 12    terconazole (TERAZOL 7) 0.4 % vaginal cream Place 1 applicator vaginally at bedtime for 3 days. 45 g 0     Review of Systems  Gastrointestinal:  Positive for vomiting. Negative for abdominal pain, constipation, diarrhea and nausea.  Genitourinary:  Positive for dyspareunia (No recent sex), dysuria and vaginal discharge (White, No apparent odor.). Negative for difficulty urinating and vaginal bleeding.   Physical Exam   Blood pressure 108/67, pulse (!) 101, temperature 98 F (36.7 C), temperature source Oral, resp. rate 14, height 5\' 1"  (1.549 m), weight 133.1 kg, last menstrual period 02/10/2021, SpO2 100 %.  Physical Exam Vitals reviewed. Exam conducted with a chaperone present.  Constitutional:      General: She is not in acute distress.    Appearance: Normal appearance. She is obese.  HENT:     Head:  Normocephalic and atraumatic.  Eyes:     Conjunctiva/sclera: Conjunctivae normal.  Cardiovascular:     Rate and Rhythm: Normal rate.  Pulmonary:     Effort: Pulmonary effort is normal. No respiratory distress.  Abdominal:     Tenderness: There is no abdominal tenderness.  Genitourinary:    Comments: Speculum Exam: -Normal External Genitalia: Non-tender fluid-filled papules and white nodules noted on thighs and vulva area c/w hidradenitis suppurativa. Moderate amt grayish white discharge at introitus.  -Vaginal Vault: Pink mucosa that appears inflamed. Copious amount curdy whitish gray discharge noted.   -Cervix:Pink, no lesions, cysts, or polyps.  Appears closed. No active bleeding from os- -Bimanual Exam: Deferred d/t patient discomfort.  Musculoskeletal:        General: Normal range of motion.     Cervical back: Normal range of motion.  Skin:     General: Skin is warm and dry.  Neurological:     Mental Status: She is alert and oriented to person, place, and time.  Psychiatric:        Mood and Affect: Mood normal.        Behavior: Behavior normal.     Fetal Assessment 150 bpm, Mod Var, + Variable Decel x 1, +Accels Toco: Irregular mild ctx graphed  MAU Course  No results found for this or any previous visit (from the past 24 hour(s)). No results found.  MDM PE Labs: None EFM Prescription Assessment and Plan  31 year old [id number]  SIUP at 25 weeks Cat I FT Overall Vaginal Pain Vulvovaginitis  -Exam performed and findings discussed. -Reassured that no vaginal "blockage" or cyst noted. -Discussed effects of delayed treatment for yeast and BV as well as UTI can cause symptoms reported.  -Informed that prescription can be printed out and given so patient can take to pharmacy of her choice. Agreeable. -Instructed to take medications concurrently. -Discussed sitz bath for increased comfort.  -Scripts for [employer] and Flagyl printed and given. -NST reactive and reassuring for GA. Variable decelerations noted and anticipated for GA.  -Encouraged to call primary office or return to MAU if symptoms worsen or with the onset of new symptoms. -Discharged to home in stable condition.  Cherre Robins MSN, CNM 08/21/2021, 6:09 PM

## 2021-08-21 NOTE — MAU Note (Signed)
...  Brianna Stein is a 31 y.o. at [redacted]w[redacted]d here in MAU reporting: July 15th she started experiencing burning with intercourse. She reports from that point forward she started experiencing vaginal pain that is sharp and shooting in nature. She reports it hurts to wipe externally. She endorses burning with urination. Endorses lower back pain that began two days ago and she reports she feels as if it is due to how she is sitting when she positions herself off of her vagina. Denies VB or LOF. +FM.  Dx with a yeast infection and BV on 7/28. Urine Culture from 7/28 positive for group b strep.  She reports she was prescribed a terazol and flagyl but has not been able to pick it up.  Onset of complaint: July 15th.  Pain score:  10/10 vagina

## 2021-08-22 ENCOUNTER — Encounter: Payer: Self-pay | Admitting: *Deleted

## 2021-09-10 ENCOUNTER — Ambulatory Visit: Payer: Medicaid Other | Attending: Obstetrics and Gynecology

## 2021-09-10 ENCOUNTER — Ambulatory Visit: Payer: Medicaid Other | Admitting: *Deleted

## 2021-09-10 ENCOUNTER — Encounter: Payer: Self-pay | Admitting: *Deleted

## 2021-09-10 VITALS — BP 116/60 | HR 103

## 2021-09-10 DIAGNOSIS — O99512 Diseases of the respiratory system complicating pregnancy, second trimester: Secondary | ICD-10-CM | POA: Diagnosis not present

## 2021-09-10 DIAGNOSIS — J45909 Unspecified asthma, uncomplicated: Secondary | ICD-10-CM

## 2021-09-10 DIAGNOSIS — Z3A27 27 weeks gestation of pregnancy: Secondary | ICD-10-CM | POA: Insufficient documentation

## 2021-09-10 DIAGNOSIS — O09212 Supervision of pregnancy with history of pre-term labor, second trimester: Secondary | ICD-10-CM | POA: Diagnosis not present

## 2021-09-10 DIAGNOSIS — O368990 Maternal care for other specified fetal problems, unspecified trimester, not applicable or unspecified: Secondary | ICD-10-CM | POA: Diagnosis not present

## 2021-09-10 DIAGNOSIS — Z6841 Body Mass Index (BMI) 40.0 and over, adult: Secondary | ICD-10-CM | POA: Insufficient documentation

## 2021-09-10 DIAGNOSIS — O0990 Supervision of high risk pregnancy, unspecified, unspecified trimester: Secondary | ICD-10-CM | POA: Insufficient documentation

## 2021-09-10 DIAGNOSIS — O09899 Supervision of other high risk pregnancies, unspecified trimester: Secondary | ICD-10-CM | POA: Insufficient documentation

## 2021-09-10 DIAGNOSIS — O0942 Supervision of pregnancy with grand multiparity, second trimester: Secondary | ICD-10-CM | POA: Diagnosis not present

## 2021-09-10 DIAGNOSIS — O99212 Obesity complicating pregnancy, second trimester: Secondary | ICD-10-CM | POA: Insufficient documentation

## 2021-09-11 ENCOUNTER — Other Ambulatory Visit: Payer: Self-pay | Admitting: *Deleted

## 2021-09-11 DIAGNOSIS — O368990 Maternal care for other specified fetal problems, unspecified trimester, not applicable or unspecified: Secondary | ICD-10-CM

## 2021-09-13 ENCOUNTER — Other Ambulatory Visit: Payer: Self-pay

## 2021-09-13 DIAGNOSIS — O0990 Supervision of high risk pregnancy, unspecified, unspecified trimester: Secondary | ICD-10-CM

## 2021-09-14 ENCOUNTER — Other Ambulatory Visit: Payer: Medicaid Other

## 2021-09-14 ENCOUNTER — Ambulatory Visit (INDEPENDENT_AMBULATORY_CARE_PROVIDER_SITE_OTHER): Payer: Medicaid Other

## 2021-09-14 VITALS — BP 114/54 | HR 85 | Wt 299.0 lb

## 2021-09-14 DIAGNOSIS — R8271 Bacteriuria: Secondary | ICD-10-CM

## 2021-09-14 DIAGNOSIS — O0993 Supervision of high risk pregnancy, unspecified, third trimester: Secondary | ICD-10-CM

## 2021-09-14 DIAGNOSIS — O0990 Supervision of high risk pregnancy, unspecified, unspecified trimester: Secondary | ICD-10-CM

## 2021-09-14 DIAGNOSIS — O09893 Supervision of other high risk pregnancies, third trimester: Secondary | ICD-10-CM

## 2021-09-14 DIAGNOSIS — Z23 Encounter for immunization: Secondary | ICD-10-CM

## 2021-09-14 DIAGNOSIS — Z3A28 28 weeks gestation of pregnancy: Secondary | ICD-10-CM

## 2021-09-14 DIAGNOSIS — Z641 Problems related to multiparity: Secondary | ICD-10-CM

## 2021-09-14 DIAGNOSIS — Z6841 Body Mass Index (BMI) 40.0 and over, adult: Secondary | ICD-10-CM

## 2021-09-14 NOTE — Progress Notes (Signed)
   PRENATAL VISIT NOTE  Subjective:  Brianna Stein is a 31 y.o. [phone] at [redacted]w[redacted]d being seen today for ongoing prenatal care.  She is currently monitored for the following issues for this high-risk pregnancy and has Preterm labor; BMI 50.0-59.9, adult (HCC); Grand multiparity; Cutaneous candidiasis; Supervision of high risk pregnancy, antepartum; Unwanted fertility; and GBS bacteriuria on their problem list.  Patient reports irregular contractions that resolve with hydration and rest.  Contractions: Irritability. Vag. Bleeding: None.  Movement: Present. Denies leaking of fluid.   The following portions of the patient's history were reviewed and updated as appropriate: allergies, current medications, past family history, past medical history, past social history, past surgical history and problem list.   Objective:   Vitals:   09/14/21 0924  BP: (!) 114/54  Pulse: 85  Weight: 299 lb (135.6 kg)    Fetal Status: Fetal Heart Rate (bpm): 142   Movement: Present     General:  Alert, oriented and cooperative. Patient is in no acute distress.  Skin: Skin is warm and dry. No rash noted.   Cardiovascular: Normal heart rate noted  Respiratory: Normal respiratory effort, no problems with respiration noted  Abdomen: Soft, gravid, appropriate for gestational age.  Pain/Pressure: Present     Pelvic: Cervical exam deferred        Extremities: Normal range of motion.     Mental Status: Normal mood and affect. Normal behavior. Normal judgment and thought content.   Assessment and Plan:  Pregnancy: [id number] at [redacted]w[redacted]d 1. Supervision of high risk pregnancy, antepartum - Routine OB. Doing well, no concerns - GTT and labs today. Tdap given - Anticipatory guidance for upcoming appointments provided  - Tdap vaccine greater than or equal to 7yo IM  2. [redacted] weeks gestation of pregnancy - Endorses active fetal movement  3. Grand multiparity - Hx 6 prior SVD's - Desires postplacental IUD with interval BTL-  previously counseled  4. BMI 50.0-59.9, adult (HCC) - Growth [place] and antenatal testing per MFM recommendations - Last growth was 1312g, 80%  5. GBS bacteriuria - PCN in labor  6. History of preterm delivery, currently pregnant in third trimester - Previous hx of 35 and 36 week delivery - Reviewed PTL s/s   Preterm labor symptoms and general obstetric precautions including but not limited to vaginal bleeding, contractions, leaking of fluid and fetal movement were reviewed in detail with the patient. Please refer to After Visit Summary for other counseling recommendations.   Return in about 2 weeks (around 09/28/2021) for [facility].  Future Appointments  Date Time Provider Department Center  10/01/2021  9:35 AM [place] Bing, MD [facility]  10/08/2021  9:30 AM [facility] NURSE [facility]  10/08/2021  9:45 AM [facility] US5 [facility]  10/15/2021  9:15 AM Milas Hock, MD [facility]  10/22/2021  9:15 AM [facility] NURSE [facility]  10/22/2021  9:30 AM [facility] US3 [facility]  10/29/2021  9:15 AM [facility] NURSE [facility]  10/29/2021  9:30 AM [facility] US3 [facility]  10/29/2021 11:15 AM Hermina Staggers, MD [facility]  11/12/2021 10:15 AM [place] Bing, MD [facility]  11/19/2021 11:15 AM Adam Phenix, MD [facility]  11/26/2021 10:15 AM [place] Bing, MD [facility]  12/03/2021 10:15 AM Macon Large, Jethro Bastos, MD [facility]  12/10/2021 10:15 AM Hermina Staggers, MD [facility]  12/10/2021 11:15 AM [facility] NST [facility]    Brand Males, CNM

## 2021-09-15 LAB — RPR: RPR Ser Ql: NONREACTIVE

## 2021-09-15 LAB — CBC
Hematocrit: 32.2 % — ABNORMAL LOW (ref 34.0–46.6)
Hemoglobin: 10.8 g/dL — ABNORMAL LOW (ref 11.1–15.9)
MCH: 28.1 pg (ref 26.6–33.0)
MCHC: 33.5 g/dL (ref 31.5–35.7)
MCV: 84 fL (ref 79–97)
Platelets: 231 10*3/uL (ref 150–450)
RBC: 3.84 x10E6/uL (ref 3.77–5.28)
RDW: 13.6 % (ref 11.7–15.4)
WBC: 8.8 10*3/uL (ref 3.4–10.8)

## 2021-09-15 LAB — HIV ANTIBODY (ROUTINE TESTING W REFLEX): HIV Screen 4th Generation wRfx: NONREACTIVE

## 2021-09-15 LAB — GLUCOSE TOLERANCE, 2 HOURS W/ 1HR
Glucose, 1 hour: 118 mg/dL (ref 70–179)
Glucose, 2 hour: 75 mg/dL (ref 70–152)
Glucose, Fasting: 79 mg/dL (ref 70–91)

## 2021-09-24 ENCOUNTER — Encounter: Payer: Self-pay | Admitting: Obstetrics and Gynecology

## 2021-09-25 ENCOUNTER — Other Ambulatory Visit: Payer: Self-pay

## 2021-09-25 DIAGNOSIS — R252 Cramp and spasm: Secondary | ICD-10-CM

## 2021-09-25 MED ORDER — CYCLOBENZAPRINE HCL 10 MG PO TABS: 10.0000 mg | ORAL_TABLET | Freq: Two times a day (BID) | ORAL | 0 refills | Status: DC | PRN

## 2021-09-25 NOTE — Telephone Encounter (Signed)
I sent a refill to her pharmacy!   Duwayne Heck

## 2021-10-01 ENCOUNTER — Encounter: Payer: Self-pay | Admitting: Obstetrics and Gynecology

## 2021-10-01 ENCOUNTER — Other Ambulatory Visit: Payer: Self-pay

## 2021-10-01 ENCOUNTER — Ambulatory Visit (INDEPENDENT_AMBULATORY_CARE_PROVIDER_SITE_OTHER): Payer: Medicaid Other | Admitting: Obstetrics and Gynecology

## 2021-10-01 VITALS — BP 99/53 | HR 108 | Wt 293.9 lb

## 2021-10-01 DIAGNOSIS — R8271 Bacteriuria: Secondary | ICD-10-CM

## 2021-10-01 DIAGNOSIS — O99213 Obesity complicating pregnancy, third trimester: Secondary | ICD-10-CM | POA: Insufficient documentation

## 2021-10-01 DIAGNOSIS — Z3009 Encounter for other general counseling and advice on contraception: Secondary | ICD-10-CM

## 2021-10-01 DIAGNOSIS — Z3A30 30 weeks gestation of pregnancy: Secondary | ICD-10-CM

## 2021-10-01 DIAGNOSIS — O0990 Supervision of high risk pregnancy, unspecified, unspecified trimester: Secondary | ICD-10-CM

## 2021-10-01 DIAGNOSIS — Z6841 Body Mass Index (BMI) 40.0 and over, adult: Secondary | ICD-10-CM

## 2021-10-01 DIAGNOSIS — O09893 Supervision of other high risk pregnancies, third trimester: Secondary | ICD-10-CM

## 2021-10-01 DIAGNOSIS — R252 Cramp and spasm: Secondary | ICD-10-CM

## 2021-10-01 MED ORDER — FERROUS SULFATE 325 (65 FE) MG PO TABS: 325.0000 mg | ORAL_TABLET | ORAL | 0 refills | Status: DC

## 2021-10-01 MED ORDER — DOCUSATE SODIUM 100 MG PO CAPS: 100.0000 mg | ORAL_CAPSULE | Freq: Two times a day (BID) | ORAL | 1 refills | Status: DC | PRN

## 2021-10-01 NOTE — Progress Notes (Signed)
   PRENATAL VISIT NOTE  Subjective:  Brianna Stein is a 31 y.o. [phone] at [redacted]w[redacted]d being seen today for ongoing prenatal care.  She is currently monitored for the following issues for this high-risk pregnancy and has History of preterm delivery, currently pregnant in third trimester; BMI 50.0-59.9, adult (HCC); Grand multiparity; Cutaneous candidiasis; Supervision of high risk pregnancy, antepartum; Unwanted fertility; GBS bacteriuria; and Obesity in pregnancy, antepartum, third trimester on their problem list.  Patient reports no complaints.  Contractions: Irritability. Vag. Bleeding: None.  Movement: Present. Denies leaking of fluid.   The following portions of the patient's history were reviewed and updated as appropriate: allergies, current medications, past family history, past medical history, past social history, past surgical history and problem list.   Objective:   Vitals:   10/01/21 0942  BP: (!) 99/53  Pulse: (!) 108  Weight: 293 lb 14.4 oz (133.3 kg)    Fetal Status: Fetal Heart Rate (bpm): 141   Movement: Present     General:  Alert, oriented and cooperative. Patient is in no acute distress.  Skin: Skin is warm and dry. No rash noted.   Cardiovascular: Normal heart rate noted  Respiratory: Normal respiratory effort, no problems with respiration noted  Abdomen: Soft, gravid, appropriate for gestational age.  Pain/Pressure: Present     Pelvic: Cervical exam deferred        Extremities: Normal range of motion.  Edema: None  Mental Status: Normal mood and affect. Normal behavior. Normal judgment and thought content.   Assessment and Plan:  Pregnancy: [id number] at [redacted]w[redacted]d 1. Contraception management  2. Unwanted fertility BTL papers signed today. I told her that it will be at the surgeon's discretion if can be done immediately PP and if not, will have d/w her re: feasibility of doing it as an interval  3. Supervision of high risk pregnancy, antepartum  4. [redacted] weeks gestation  of pregnancy Normal 28wk labs with Hgb 10.8. recommend qod iron  5. Obesity in pregnancy, antepartum, third trimester Weight stable Already scheduled for qwk bpps at 34wks per mfm. Normal growth u/s on 8/21  6. BMI 50.0-59.9, adult (HCC)  7. GBS bacteriuria Too low to treat before labor  8. History of preterm delivery, currently pregnant in third trimester Precautions given.   Preterm labor symptoms and general obstetric precautions including but not limited to vaginal bleeding, contractions, leaking of fluid and fetal movement were reviewed in detail with the patient. Please refer to After Visit Summary for other counseling recommendations.   Return in about 2 weeks (around 10/15/2021) for in person, md or app, low risk ob.  Future Appointments  Date Time Provider Department Center  10/08/2021  9:30 AM [facility] NURSE [facility]  10/08/2021  9:45 AM [facility] US5 [facility]  10/15/2021  9:15 AM Milas Hock, MD [facility]  10/22/2021  9:15 AM [facility] NURSE [facility]  10/22/2021  9:30 AM [facility] US3 [facility]  10/29/2021  9:15 AM [facility] NURSE [facility]  10/29/2021  9:30 AM [facility] US3 [facility]  10/29/2021 11:15 AM Hermina Staggers, MD [facility]  11/12/2021 10:15 AM [place] Bing, MD [facility]  11/19/2021 11:15 AM Adam Phenix, MD [facility]  11/26/2021 10:15 AM [place] Bing, MD [facility]  12/03/2021 10:15 AM Macon Large, Jethro Bastos, MD [facility]  12/10/2021 10:15 AM Hermina Staggers, MD [facility]  12/10/2021 11:15 AM [facility] NST [facility]    [place] Bing, MD

## 2021-10-08 ENCOUNTER — Encounter: Payer: Medicaid Other | Admitting: Certified Nurse Midwife

## 2021-10-08 ENCOUNTER — Ambulatory Visit: Payer: Medicaid Other | Attending: Obstetrics and Gynecology

## 2021-10-08 ENCOUNTER — Ambulatory Visit: Payer: Medicaid Other | Admitting: *Deleted

## 2021-10-08 VITALS — BP 118/71 | HR 95

## 2021-10-08 DIAGNOSIS — O0943 Supervision of pregnancy with grand multiparity, third trimester: Secondary | ICD-10-CM | POA: Diagnosis not present

## 2021-10-08 DIAGNOSIS — Z3A31 31 weeks gestation of pregnancy: Secondary | ICD-10-CM

## 2021-10-08 DIAGNOSIS — O99213 Obesity complicating pregnancy, third trimester: Secondary | ICD-10-CM

## 2021-10-08 DIAGNOSIS — O09213 Supervision of pregnancy with history of pre-term labor, third trimester: Secondary | ICD-10-CM

## 2021-10-08 DIAGNOSIS — O0990 Supervision of high risk pregnancy, unspecified, unspecified trimester: Secondary | ICD-10-CM

## 2021-10-08 DIAGNOSIS — O99513 Diseases of the respiratory system complicating pregnancy, third trimester: Secondary | ICD-10-CM | POA: Diagnosis not present

## 2021-10-08 DIAGNOSIS — J45909 Unspecified asthma, uncomplicated: Secondary | ICD-10-CM | POA: Diagnosis not present

## 2021-10-08 DIAGNOSIS — E669 Obesity, unspecified: Secondary | ICD-10-CM

## 2021-10-08 DIAGNOSIS — O368990 Maternal care for other specified fetal problems, unspecified trimester, not applicable or unspecified: Secondary | ICD-10-CM | POA: Insufficient documentation

## 2021-10-10 ENCOUNTER — Inpatient Hospital Stay (HOSPITAL_COMMUNITY)
Admission: AD | Admit: 2021-10-10 | Discharge: 2021-10-10 | Disposition: A | Discharge status: Home / Self Care | Payer: Medicaid Other | Attending: Family Medicine | Admitting: Family Medicine

## 2021-10-10 ENCOUNTER — Encounter (HOSPITAL_COMMUNITY): Payer: Self-pay | Admitting: Obstetrics and Gynecology

## 2021-10-10 ENCOUNTER — Inpatient Hospital Stay (HOSPITAL_BASED_OUTPATIENT_CLINIC_OR_DEPARTMENT_OTHER): Payer: Medicaid Other

## 2021-10-10 DIAGNOSIS — Z3A32 32 weeks gestation of pregnancy: Secondary | ICD-10-CM

## 2021-10-10 DIAGNOSIS — O4703 False labor before 37 completed weeks of gestation, third trimester: Secondary | ICD-10-CM

## 2021-10-10 DIAGNOSIS — O09213 Supervision of pregnancy with history of pre-term labor, third trimester: Secondary | ICD-10-CM

## 2021-10-10 DIAGNOSIS — O99213 Obesity complicating pregnancy, third trimester: Secondary | ICD-10-CM | POA: Diagnosis not present

## 2021-10-10 DIAGNOSIS — O0943 Supervision of pregnancy with grand multiparity, third trimester: Secondary | ICD-10-CM | POA: Diagnosis not present

## 2021-10-10 DIAGNOSIS — Z3A35 35 weeks gestation of pregnancy: Secondary | ICD-10-CM | POA: Diagnosis present

## 2021-10-10 DIAGNOSIS — E669 Obesity, unspecified: Secondary | ICD-10-CM

## 2021-10-10 LAB — URINALYSIS, ROUTINE W REFLEX MICROSCOPIC
Bilirubin Urine: NEGATIVE
Glucose, UA: NEGATIVE mg/dL
Hgb urine dipstick: NEGATIVE
Ketones, ur: 80 mg/dL — AB
Nitrite: NEGATIVE
Protein, ur: NEGATIVE mg/dL
Specific Gravity, Urine: 1.009 (ref 1.005–1.030)
pH: 5 (ref 5.0–8.0)

## 2021-10-10 MED ORDER — LACTATED RINGERS IV BOLUS
1000.0000 mL | Freq: Once | INTRAVENOUS | Status: AC
  Administered 2021-10-10: 1000 mL via INTRAVENOUS

## 2021-10-10 MED ORDER — TERBUTALINE SULFATE 1 MG/ML IJ SOLN: 0.2500 mg | Freq: Once | INTRAMUSCULAR | Status: AC

## 2021-10-10 MED ORDER — TERBUTALINE SULFATE 1 MG/ML IJ SOLN
INTRAMUSCULAR | Status: AC
  Administered 2021-10-10: 0.25 mg via SUBCUTANEOUS
  Filled 2021-10-10: qty 1

## 2021-10-10 MED ORDER — TERBUTALINE SULFATE 1 MG/ML IJ SOLN
INTRAMUSCULAR | Status: AC
  Filled 2021-10-10: qty 1

## 2021-10-10 MED ORDER — TERBUTALINE SULFATE 1 MG/ML IJ SOLN: 0.2500 mg | Freq: Once | INTRAMUSCULAR | Status: DC

## 2021-10-10 MED ORDER — NIFEDIPINE 10 MG PO CAPS
10.0000 mg | ORAL_CAPSULE | ORAL | Status: AC | PRN
  Administered 2021-10-10 (×3): 10 mg via ORAL
  Filled 2021-10-10 (×3): qty 1

## 2021-10-10 NOTE — MAU Note (Signed)
.  Brianna Stein is a 31 y.o. at [redacted]w[redacted]d here in MAU reporting: contractions since this am, getting much worse about an hour ago. Denies bleeding or ROM. Reports positive fetal movement.  Onset of complaint: this am Pain score: 8/10 There were no vitals filed for this visit.   FHT:158 Lab orders placed from triage:

## 2021-10-10 NOTE — MAU Provider Note (Signed)
Chief Complaint:  Contractions   Event Date/Time   First Provider Initiated Contact with Patient 10/10/21 1853      HPI: Brianna Stein is a 31 y.o. [id number] at [redacted]w[redacted]d by early ultrasound who presents to maternity admissions via EMS for painful contractions since this morning, becoming severe and 2 minutes apart approximately one hour before arrival in MAU.  She is feeling fetal movement and denies vaginal bleeding or leaking fluid. She reports pressure and intermittent urge to push.   HPI  Past Medical History: Past Medical History:  Diagnosis Date   Asthma    BMI 50.0-59.9, adult ([place]) 04/28/2019    Past obstetric history: OB History  Gravida Para Term Preterm AB Living  8 6 4 2 1 6   SAB IAB Ectopic Multiple Live Births  1 0 0 0 6    # Outcome Date GA Lbr Len/2nd Weight Sex Delivery Anes PTL Lv  8 Current           7 Preterm 04/27/19 [redacted]w[redacted]d 00:22 / 00:07 2736 g M Vag-Spont EPI  LIV  6 Term 12/01/16 [redacted]w[redacted]d  3175 g M Vag-Spont EPI N LIV  5 Preterm 12/18/15 [redacted]w[redacted]d  2438 g F Vag-Spont EPI N LIV  4 Term 12/10/14 [redacted]w[redacted]d  3175 g M Vag-Spont  N LIV  3 Term 06/24/13 [redacted]w[redacted]d  2920 g F Vag-Spont EPI N LIV  2 SAB 2015          1 Term 01/02/09 [redacted]w[redacted]d  2948 g F Vag-Spont  Y LIV    Past Surgical History: Past Surgical History:  Procedure Laterality Date   NO PAST SURGERIES      Family History: History reviewed. No pertinent family history.  Social History: Social History   Tobacco Use   Smoking status: Never   Smokeless tobacco: Never  Vaping Use   Vaping Use: Never used  Substance Use Topics   Alcohol use: Never   Drug use: Never    Allergies: No Known Allergies  Meds:  Medications Prior to Admission  Medication Sig Dispense Refill Last Dose   cyclobenzaprine (FLEXERIL) 10 MG tablet Take 1 tablet (10 mg total) by mouth 2 (two) times daily as needed for muscle spasms. 30 tablet 0 Past Week   ferrous sulfate (FERROUSUL) 325 (65 FE) MG tablet Take 1 tablet (325 mg total) by  mouth every other day for 40 doses. 40 tablet 0 10/09/2021   docusate sodium (COLACE) 100 MG capsule Take 1 capsule (100 mg total) by mouth 2 (two) times daily as needed for mild constipation. 30 capsule 1 Unknown    ROS:  Review of Systems  Constitutional:  Negative for chills, fatigue and fever.  Eyes:  Negative for visual disturbance.  Respiratory:  Negative for shortness of breath.   Cardiovascular:  Negative for chest pain.  Gastrointestinal:  Positive for abdominal pain. Negative for nausea and vomiting.  Genitourinary:  Positive for pelvic pain. Negative for difficulty urinating, dysuria, flank pain, vaginal bleeding, vaginal discharge and vaginal pain.  Musculoskeletal:  Positive for back pain.  Neurological:  Negative for dizziness and headaches.  Psychiatric/Behavioral: Negative.       I have reviewed patient's Past Medical Hx, Surgical Hx, Family Hx, Social Hx, medications and allergies.   Physical Exam  Patient Vitals for the past 24 hrs:  BP Temp Pulse Resp SpO2 Height  10/10/21 2114 134/78 -- -- -- -- --  10/10/21 2051 138/87 -- (!) 113 -- -- --  10/10/21 2050 -- -- -- --  100 % --  10/10/21 2045 -- -- -- -- 100 % --  10/10/21 2040 -- -- -- -- 100 % --  10/10/21 2030 -- -- -- -- 100 % --  10/10/21 2025 -- -- -- -- (!) 86 % --  10/10/21 2015 -- -- -- -- 98 % --  10/10/21 2010 -- -- -- -- 100 % --  10/10/21 2005 -- -- -- -- 100 % --  10/10/21 2000 -- -- -- -- 100 % --  10/10/21 1955 -- -- -- -- 99 % --  10/10/21 1950 -- -- -- -- 96 % --  10/10/21 1945 -- -- -- -- 100 % --  10/10/21 1940 -- -- -- -- 100 % --  10/10/21 1936 -- -- -- -- 99 % --  10/10/21 1935 -- -- -- -- 99 % --  10/10/21 1930 -- -- -- -- 99 % --  10/10/21 1925 -- -- -- -- 99 % --  10/10/21 1920 -- -- -- -- 99 % --  10/10/21 1915 -- -- -- -- 99 % --  10/10/21 1910 -- -- -- -- 100 % --  10/10/21 1905 -- -- -- -- 100 % --  10/10/21 1900 -- -- -- -- 99 % --  10/10/21 1855 -- -- -- -- 99 % --   10/10/21 1851 116/80 98 F (36.7 C) 96 19 99 %  (1.549 m)   Constitutional: Well-developed, well-nourished female in no acute distress.  Cardiovascular: normal rate Respiratory: normal effort GI: Abd soft, non-tender, gravid appropriate for gestational age.  MS: Extremities nontender, no edema, normal ROM Neurologic: Alert and oriented x 4.  GU: Neg CVAT.  PELVIC EXAM: Cervix pink, visually closed, without lesion, scant white creamy discharge, vaginal walls and external genitalia normal Bimanual exam: Cervix 0/long/high, firm, anterior, neg CMT, uterus nontender, nonenlarged, adnexa without tenderness, enlargement, or mass  Dilation: 4 Effacement (%): 50 Cervical Position: Posterior Presentation: Vertex Exam by:: leftwich-kirby,cnm  FHT:  Baseline 135 , moderate variability, accelerations present, no decelerations Contractions: q 1-2 mins   Labs: No results found for this or any previous visit (from the past 24 hour(s)). A/Positive/-- (05/03 1530)  Imaging:  [place] MFM OB FOLLOW UP  Result Date: 10/08/2021 ----------------------------------------------------------------------  OBSTETRICS REPORT                       (Signed Final 10/08/2021 10:21 am) ---------------------------------------------------------------------- Patient Info  ID #:       [id number]                          D.O.B.:  08/08/1990 (31 yrs)  Name:       Brianna Stein                   Visit Date: 10/08/2021 10:01 am ---------------------------------------------------------------------- Performed By  Attending:        Ma Rings MD         Ref. Address:     [facility]  Performed By:     Cyndie Mull          [facility]         [facility]  Referred By:      Bernerd Limbo CNM ---------------------------------------------------------------------- Orders  #  Description                           Code        Ordered By  1  [place] MFM OB FOLLOW UP                   [phone]    [facility] ----------------------------------------------------------------------  #  Order #                     Accession #                Episode #  1  [id number]                   [id number]                 [id number] ---------------------------------------------------------------------- Indications  Obesity complicating pregnancy, third          O99.213  trimester (BMI 56)  Poor obstetric history: Previous preterm       O09.219  delivery, antepartum (35 wks and 36 wks)  Grand multiparity, antepartum                  O09.40  Poor obstetric history (prior pre-term labor)  O09.219  Low Risk NIPS(Negative AFP)(Negative  Horizon 2020)  [redacted] weeks gestation of pregnancy                Z3A.31  Asthma                                         O99.89 j45.909 ---------------------------------------------------------------------- Vital Signs  BP:          118/71 ---------------------------------------------------------------------- Fetal Evaluation  Num Of Fetuses:         1  Fetal Heart Rate(bpm):  149  Cardiac Activity:       Observed  Presentation:  Cephalic  Placenta:               Posterior  P. Cord Insertion:      Previously Visualized  Amniotic Fluid  AFI FV:      Within normal limits  AFI Sum(cm)     %Tile       Largest Pocket(cm)  18.45           69          5.88  RUQ(cm)       RLQ(cm)       LUQ(cm)        LLQ(cm)  5.38          2.81          5.88           4.38 ---------------------------------------------------------------------- Biometry  BPD:     79.86  mm     G. Age:  32w 0d         47  %    CI:        75.47   %    70 - 86                                                           FL/HC:      20.4   %    19.1 - 21.3  HC:    291.51   mm     G. Age:  32w 1d         20  %    HC/AC:      1.00        0.96 - 1.17  AC:      290.1  mm     G. Age:  33w 0d         80  %    FL/BPD:     74.5   %    71 - 87  FL:      59.53  mm     G. Age:  31w 0d         17  %    FL/AC:      20.5   %    20 - 24  Est. FW:    1935  gm      4 lb 4 oz     52  % ---------------------------------------------------------------------- OB History  Gravidity:    8         Term:   4        Prem:   2        SAB:   1  Ectopic:      0        Living:  6 ---------------------------------------------------------------------- Gestational Age  LMP:           34w 2d        Date:  02/10/21                  EDD:   11/17/21  U/S Today:     32w 0d                                        EDD:   12/03/21  Best:  31w 6d     Det. ByMarcella Dubs         EDD:   12/04/21                                      (04/13/21) ---------------------------------------------------------------------- Anatomy  Cranium:               Appears normal         Aortic Arch:            Previously seen  Cavum:                 Appears normal         Ductal Arch:            Previously seen  Ventricles:            Previously seen        Diaphragm:              Previously seen  Choroid Plexus:        Previously seen        Stomach:                Appears normal, left                                                                        sided  Cerebellum:            Previously seen        Abdomen:                Appears normal  Posterior Fossa:       Previously seen        Abdominal Wall:         Previously seen  Nuchal Fold:           Previously seen        Cord Vessels:           Previously seen  Face:                  Orbits and profile     Kidneys:                Appear normal                         previously seen  Lips:                  Previously seen        Bladder:                Appears normal  Thoracic:              Appears normal         Spine:                   Previously seen  Heart:                 Previously seen        Upper Extremities:      Previously seen  RVOT:                  Previously seen        Lower Extremities:      Previously seen  LVOT:                  Previously seen  Other:  VC, 3VV, 3VTV and Heels previously. Hands not well visualized.          Technically difficult due to maternal habitus and fetal position. ---------------------------------------------------------------------- Cervix Uterus Adnexa  Adnexa  No abnormality visualized. ---------------------------------------------------------------------- Comments  This patient was seen for a follow up growth scan due to  maternal obesity with a BMI of 56 and grand multiparity.  She  denies any problems since her last exam and has screened  negative for gestational diabetes.  She was informed that the fetal growth and amniotic fluid  level appears appropriate for her gestational age.  Due to maternal obesity, we will start weekly fetal testing at  34 weeks.  She will return in 2 weeks for a BPP. ----------------------------------------------------------------------                   Ma Rings, MD Electronically Signed Final Report   10/08/2021 10:21 am ----------------------------------------------------------------------   MAU Course/MDM: Orders Placed This Encounter  Procedures   [place] MFM OB Limited   Urinalysis, Routine w reflex microscopic Urine, Clean Catch   Discharge patient    Meds ordered this encounter  Medications   DISCONTD: terbutaline (BRETHINE) injection 0.25 mg   lactated ringers bolus 1,000 mL   terbutaline (BRETHINE) 1 MG/ML injection    [place], J: cabinet override   terbutaline (BRETHINE) injection 0.25 mg   terbutaline (BRETHINE) 1 MG/ML injection    [place], [place]: cabinet override   NIFEdipine (PROCARDIA) capsule 10 mg     NST reviewed and reactive Cervix 3-4, thick, posterior with no evidence of labor but with frequent painful  contractions Given frequency of contractions with little uterine rest and no evidence of labor, bedside US to evaluate Polyhydramnios on [place] with normal appearing placenta Terbutaline and Procardia given Contractions on TOCO less often, pt continues to report 8/10 pain and pelvic pressure Cervix rechecked 3+ hours since arrival in MAU and unchanged, remains 4/50/high station Pt desires d/c home from MAU, reports she wants to eat and get some sleep Consult Dr Shawnie Pons with assessment and findings D/C home with PTL precautions   Assessment: 1. Threatened premature labor in third trimester   2. [redacted] weeks gestation of pregnancy     Plan: Discharge home Labor precautions and fetal kick counts  Follow-up Information     [facility] Follow up.   Specialty: Obstetrics and Gynecology Why: As scheduled Contact information: [address] [place] [address] [phone]        [facility] Follow up.   Specialty: Obstetrics and Gynecology Why: As needed for emergencies Contact information: [address] [id number] Wilhemina Bonito [place] [address] [phone]               Allergies as of 10/10/2021   No Known Allergies      Medication List     TAKE these medications    cyclobenzaprine 10 MG tablet Commonly known as: FLEXERIL Take 1 tablet (10 mg total) by mouth 2 (two) times daily as needed for muscle spasms.   docusate sodium 100 MG capsule Commonly known as:  Colace Take 1 capsule (100 mg total) by mouth 2 (two) times daily as needed for mild constipation.   ferrous sulfate 325 (65 FE) MG tablet Commonly known as: FerrouSul Take 1 tablet (325 mg total) by mouth every other day for 40 doses.        Sharen Counter Certified Nurse-Midwife 10/10/2021 10:26 PM

## 2021-10-10 NOTE — Progress Notes (Signed)
   PRENATAL VISIT NOTE  Subjective:  Brianna Stein is a 31 y.o. [id number] at [redacted]w[redacted]d being seen today for ongoing prenatal care.  She is currently monitored for the following issues for this high-risk pregnancy and has History of preterm delivery, currently pregnant in third trimester; BMI 50.0-59.9, adult ([place]); [place] multiparity; Cutaneous candidiasis; Supervision of high risk pregnancy, antepartum; Unwanted fertility; GBS bacteriuria; Obesity in pregnancy, antepartum, third trimester; and Polyhydramnios affecting pregnancy in third trimester on their problem list.  Patient reports  PT ctxns .  Contractions: Irritability. Vag. Bleeding: None.  Movement: Present. Denies leaking of fluid.   The following portions of the patient's history were reviewed and updated as appropriate: allergies, current medications, past family history, past medical history, past social history, past surgical history and problem list.   Objective:   Vitals:   10/15/21 0931  BP: 122/73  Pulse: 100  Weight: 299 lb (135.6 kg)    Fetal Status: Fetal Heart Rate (bpm): 140   Movement: Present     General:  Alert, oriented and cooperative. Patient is in no acute distress.  Skin: Skin is warm and dry. No rash noted.   Cardiovascular: Normal heart rate noted  Respiratory: Normal respiratory effort, no problems with respiration noted  Abdomen: Soft, gravid, appropriate for gestational age.  Pain/Pressure: Present     Pelvic: Cervical exam performed in the presence of a chaperone      NO change from MAU  Extremities: Normal range of motion.  Edema: None  Mental Status: Normal mood and affect. Normal behavior. Normal judgment and thought content.   Assessment and Plan:  Pregnancy: [id number] at [redacted]w[redacted]d 1. History of preterm delivery, currently pregnant in third trimester 2 deliveries at 35-36 weeks but also 4 FT SVDs She would like to proceed with BMZ in the event of PTB. She has PT Ctxns, but we reviewed at this time not  laboring. Reviewed PTL precautions.   2. BMI 50.0-59.9, adult ([place])  3. [place] multiparity HgB is normal (10.8) to optimize for risk of PPH Would consider TXA at delivery  4. Supervision of high risk pregnancy, antepartum Start BPP at 34 weeks. Will have front start to schedule   5. Unwanted fertility Plan is for PPIUD and interval salpingectomy  6. GBS bacteriuria PCN in labor  Preterm labor symptoms and general obstetric precautions including but not limited to vaginal bleeding, contractions, leaking of fluid and fetal movement were reviewed in detail with the patient. Please refer to After Visit Summary for other counseling recommendations.   No follow-ups on file.  Future Appointments  Date Time Provider [place]  10/22/2021  9:15 AM [facility] NURSE [facility]  10/22/2021  9:30 AM [facility] US3 [facility]  10/29/2021  9:15 AM [facility] NURSE [facility]  10/29/2021  9:30 AM [facility] US3 [facility]  10/29/2021 11:15 AM Chancy Milroy, MD [facility]  11/12/2021 10:15 AM Aletha Halim, MD [facility]  11/19/2021 11:15 AM Aletha Halim, MD [facility]  11/26/2021 10:15 AM Aletha Halim, MD [facility]  12/03/2021 10:15 AM Harolyn Rutherford, Sallyanne Havers, MD [facility]  12/10/2021 10:15 AM Chancy Milroy, MD [facility]  12/10/2021 11:15 AM [facility] NST [facility]    Radene Gunning, MD

## 2021-10-10 NOTE — Discharge Instructions (Signed)
Reasons to return to MAU at [facility]:  Since you are preterm, return to MAU if:  1.  Contractions are 10 minutes apart or less and they becoming more uncomfortable or painful over time 2.  You have a large gush of fluid, or a trickle of fluid that will not stop and you have to wear a pad 3.  You have bleeding that is bright red, heavier than spotting--like menstrual bleeding (spotting can be normal in early labor or after a check of your cervix) 4.  You do not feel the baby moving like he/she normally does  

## 2021-10-11 ENCOUNTER — Encounter (HOSPITAL_BASED_OUTPATIENT_CLINIC_OR_DEPARTMENT_OTHER): Payer: Self-pay | Admitting: Advanced Practice Midwife

## 2021-10-11 DIAGNOSIS — O403XX0 Polyhydramnios, third trimester, not applicable or unspecified: Secondary | ICD-10-CM | POA: Insufficient documentation

## 2021-10-12 ENCOUNTER — Encounter (HOSPITAL_COMMUNITY): Payer: Self-pay | Admitting: Obstetrics and Gynecology

## 2021-10-12 ENCOUNTER — Other Ambulatory Visit: Payer: Self-pay

## 2021-10-12 ENCOUNTER — Inpatient Hospital Stay (HOSPITAL_COMMUNITY)
Admission: AD | Admit: 2021-10-12 | Discharge: 2021-10-12 | Disposition: A | Discharge status: Home / Self Care | Payer: Medicaid Other | Attending: Obstetrics and Gynecology | Admitting: Obstetrics and Gynecology

## 2021-10-12 ENCOUNTER — Telehealth: Payer: Medicaid Other | Admitting: Physician Assistant

## 2021-10-12 DIAGNOSIS — O4700 False labor before 37 completed weeks of gestation, unspecified trimester: Secondary | ICD-10-CM | POA: Diagnosis not present

## 2021-10-12 DIAGNOSIS — Z3A32 32 weeks gestation of pregnancy: Secondary | ICD-10-CM | POA: Insufficient documentation

## 2021-10-12 DIAGNOSIS — O4703 False labor before 37 completed weeks of gestation, third trimester: Secondary | ICD-10-CM | POA: Diagnosis present

## 2021-10-12 DIAGNOSIS — O0990 Supervision of high risk pregnancy, unspecified, unspecified trimester: Secondary | ICD-10-CM

## 2021-10-12 DIAGNOSIS — O09893 Supervision of other high risk pregnancies, third trimester: Secondary | ICD-10-CM

## 2021-10-12 LAB — URINALYSIS, ROUTINE W REFLEX MICROSCOPIC
Bilirubin Urine: NEGATIVE
Glucose, UA: NEGATIVE mg/dL
Hgb urine dipstick: NEGATIVE
Ketones, ur: 80 mg/dL — AB
Nitrite: NEGATIVE
Protein, ur: NEGATIVE mg/dL
Specific Gravity, Urine: 1.014 (ref 1.005–1.030)
pH: 5 (ref 5.0–8.0)

## 2021-10-12 LAB — WET PREP, GENITAL
Clue Cells Wet Prep HPF POC: NONE SEEN
Sperm: NONE SEEN
Trich, Wet Prep: NONE SEEN
WBC, Wet Prep HPF POC: >=10 — AB (ref <10)
Yeast Wet Prep HPF POC: NONE SEEN

## 2021-10-12 MED ORDER — NIFEDIPINE 10 MG PO CAPS: 10.0000 mg | ORAL_CAPSULE | ORAL | Status: AC

## 2021-10-12 MED ORDER — LACTATED RINGERS IV BOLUS: 1000.0000 mL | Freq: Once | INTRAVENOUS | Status: DC

## 2021-10-12 MED ORDER — NIFEDIPINE 10 MG PO CAPS: 10.0000 mg | ORAL_CAPSULE | Freq: Three times a day (TID) | ORAL | 0 refills | Status: DC | PRN

## 2021-10-12 NOTE — Progress Notes (Signed)
Virtual Visit Consent   Brianna Stein, you are scheduled for a virtual visit with a [facility] provider today. Just as with appointments in the office, your consent must be obtained to participate. Your consent will be active for this visit and any virtual visit you may have with one of our providers in the next 365 days. If you have a MyChart account, a copy of this consent can be sent to you electronically.  As this is a virtual visit, video technology does not allow for your provider to perform a traditional examination. This may limit your provider's ability to fully assess your condition. If your provider identifies any concerns that need to be evaluated in person or the need to arrange testing (such as labs, EKG, etc.), we will make arrangements to do so. Although advances in technology are sophisticated, we cannot ensure that it will always work on either your end or our end. If the connection with a video visit is poor, the visit may have to be switched to a telephone visit. With either a video or telephone visit, we are not always able to ensure that we have a secure connection.  By engaging in this virtual visit, you consent to the provision of healthcare and authorize for your insurance to be billed (if applicable) for the services provided during this visit. Depending on your insurance coverage, you may receive a charge related to this service.  I need to obtain your verbal consent now. Are you willing to proceed with your visit today? [name] has provided verbal consent on 10/12/2021 for a virtual visit (video or telephone). Margaretann Loveless, PA-C  Date: 10/12/2021 11:56 AM  Virtual Visit via Video Note   I, Margaretann Loveless, connected with  Lennon Richins  ([id number], [birth year]) on 10/12/21 at 11:45 AM EDT by a video-enabled telemedicine application and verified that I am speaking with the correct person using two identifiers.  Location: Patient: Virtual Visit Location  Patient: Home Provider: Virtual Visit Location Provider: Home Office   I discussed the limitations of evaluation and management by telemedicine and the availability of in person appointments. The patient expressed understanding and agreed to proceed.    History of Present Illness: Brianna Stein is a 31 y.o. who identifies as a female who was assigned female at birth, and is being seen today for contractions. Patient is [redacted]w[redacted]d pregnant and this is her 8th pregnancy, 6 live births. She started having contractions on Wednesday, 10/12/21 and was seen at the MAU. [place] was normal, no active labor. However, she had dilated up to 4cm. She was given a shot and an oral medication to slow dilatation. She reports yesterday she stayed on bedrest and had no issues. Today she has still be on bedrest but is now having a lot of vaginal pressure and contractions starting back, not as frequent as Wednesday yet. Last labor only lasted 35 minutes.   Problems:  Patient Active Problem List   Diagnosis Date Noted   Polyhydramnios affecting pregnancy in third trimester 10/11/2021   Obesity in pregnancy, antepartum, third trimester 10/01/2021   GBS bacteriuria 05/28/2021   Unwanted fertility 05/23/2021   Supervision of high risk pregnancy, antepartum 05/02/2021   Cutaneous candidiasis 10/04/2020   [place] multiparity 04/30/2019   BMI 50.0-59.9, adult (H[facility]) 04/28/2019   History of preterm delivery, currently pregnant in third trimester 04/27/2019    Allergies: No Known Allergies Medications:  Current Outpatient Medications:    cyclobenzaprine (FLEXERIL) 10 MG tablet, Take 1 tablet (  10 mg total) by mouth 2 (two) times daily as needed for muscle spasms., Disp: 30 tablet, Rfl: 0   docusate sodium (COLACE) 100 MG capsule, Take 1 capsule (100 mg total) by mouth 2 (two) times daily as needed for mild constipation., Disp: 30 capsule, Rfl: 1   ferrous sulfate (FERROUSUL) 325 (65 FE) MG tablet, Take 1 tablet (325 mg total) by  mouth every other day for 40 doses., Disp: 40 tablet, Rfl: 0  Observations/Objective: Patient is well-developed, well-nourished in no acute distress.  Resting comfortably at home.  Head is normocephalic, atraumatic.  No labored breathing.  Speech is clear and coherent with logical content.  Patient is alert and oriented at baseline.    Assessment and Plan: 1. Threatened premature labor in third trimester  - Advised to call OB/GYN office and talk to nurse on call for advise and see if they can fast-track her through MAU waiting - Advised to be seen at MAU as well ASAP  Follow Up Instructions: I discussed the assessment and treatment plan with the patient. The patient was provided an opportunity to ask questions and all were answered. The patient agreed with the plan and demonstrated an understanding of the instructions.  A copy of instructions were sent to the patient via MyChart unless otherwise noted below.    The patient was advised to call back or seek an in-person evaluation if the symptoms worsen or if the condition fails to improve as anticipated.  Time:  I spent 10 minutes with the patient via telehealth technology discussing the above problems/concerns.    Mar Daring, PA-C

## 2021-10-12 NOTE — MAU Provider Note (Signed)
History     CSN: [id number]  Arrival date and time: 10/12/21 1348   None     Chief Complaint  Patient presents with   Contractions   HPI Brianna Stein is a 31 y.o. [id number] at [redacted]w[redacted]d who presents to MAU for contractions. Patient reports contractions started this morning and are every 4-5 minutes apart. She also reports a lot of pressure in lower abdomen and pelvis. She denies vaginal bleeding or leaking fluid. Endorses normal active fetal movement.   Patient receives prenatal care at [facility]. Next appointment is scheduled on Monday.   OB History     Gravida  8   Para  6   Term  4   Preterm  2   AB  1   Living  6      SAB  1   IAB  0   Ectopic  0   Multiple  0   Live Births  6           Past Medical History:  Diagnosis Date   Asthma    BMI 50.0-59.9, adult (H[facility]) 04/28/2019    Past Surgical History:  Procedure Laterality Date   NO PAST SURGERIES      No family history on file.  Social History   Tobacco Use   Smoking status: Never   Smokeless tobacco: Never  Vaping Use   Vaping Use: Never used  Substance Use Topics   Alcohol use: Never   Drug use: Never    Allergies: No Known Allergies  Medications Prior to Admission  Medication Sig Dispense Refill Last Dose   cyclobenzaprine (FLEXERIL) 10 MG tablet Take 1 tablet (10 mg total) by mouth 2 (two) times daily as needed for muscle spasms. 30 tablet 0    docusate sodium (COLACE) 100 MG capsule Take 1 capsule (100 mg total) by mouth 2 (two) times daily as needed for mild constipation. 30 capsule 1    ferrous sulfate (FERROUSUL) 325 (65 FE) MG tablet Take 1 tablet (325 mg total) by mouth every other day for 40 doses. 40 tablet 0    Review of Systems  Constitutional: Negative.   Respiratory: Negative.    Cardiovascular: Negative.   Gastrointestinal:  Positive for abdominal pain (contractions).  Genitourinary: Negative.   Musculoskeletal: Negative.   Neurological: Negative.    Physical Exam    Blood pressure 122/71, pulse 96, temperature 98.1 F (36.7 C), temperature source Oral, resp. rate 18, height 5\' 1"  (1.549 m), weight 133 kg, last menstrual period 02/10/2021, SpO2 100 %.  Physical Exam Vitals and nursing note reviewed. Exam conducted with a chaperone present.  Constitutional:      General: She is not in acute distress.    Appearance: She is obese.  Eyes:     Extraocular Movements: Extraocular movements intact.     Pupils: Pupils are equal, round, and reactive to light.  Cardiovascular:     Rate and Rhythm: Normal rate.  Pulmonary:     Effort: Pulmonary effort is normal.  Abdominal:     Palpations: Abdomen is soft.     Tenderness: There is no abdominal tenderness.     Comments: gravid  Genitourinary:    Comments: Blind swabs collected  Musculoskeletal:        General: Normal range of motion.     Cervical back: Normal range of motion.  Skin:    General: Skin is warm and dry.  Neurological:     General: No focal deficit present.  Mental Status: She is alert and oriented to person, place, and time.  Psychiatric:        Mood and Affect: Mood normal.        Behavior: Behavior normal.   Dilation: 3.5 Effacement (%): Thick Station: Ballotable Exam by:: Maryagnes Amos, CNM  NST FHR: 135 bpm, moderate variability, +15x15 accels, no decels Toco: Q 55mins  MAU Course  Procedures  MDM UA NST  On arrival patient breathing through contractions and appears to be uncomfortable. Wet prep, GC/CT collected. Cervix unchanged from exam on previous visit. Discussed IVFs and Procardia which patient agrees to, however once CNM left room patient reported to RN that she did not want either and wishes to be discharged home. 2 hours after arrival cervix remains unchanged. I discussed with patient the importance of adequate hydration/nutrition as she has >80 ketones on UA. She continues to report that contractions are the same as when she arrived to MAU however she is  browsing on phone and laughing/talking with CNM. She is requesting to be discharged as she has an appointment on Monday. I offered to discharge patient home on Procardia for comfort.   Assessment and Plan  [redacted] weeks gestation of pregnancy Preterm contractions  - Discharge home in stable condition - Strict return precautions. Return to MAU as needed for new/worsening symptoms - Keep OB appointment as scheduled   Renee Harder, CNM 10/12/2021, 5:49 PM

## 2021-10-12 NOTE — MAU Note (Signed)
Brianna Stein is a 31 y.o. at [redacted]w[redacted]d here in MAU reporting: she's been having ctxs that are 5 minutes apart since 0930 this morning.  Also reports constant pelvis pressure that worsens with ctxs. Denies VB or LOF.  Endorses +FM. LMP: NA Onset of complaint: today Pain score: 7 Vitals:   10/12/21 1410  BP: 122/71  Pulse: 96  Resp: 18  Temp: 98.1 F (36.7 C)  SpO2: 100%     AES:LPNPYYFR d/t maternal apparel Lab orders placed from triage:   UA

## 2021-10-12 NOTE — Patient Instructions (Signed)
  [name], thank you for joining Mar Daring, PA-C for today's virtual visit.  While this provider is not your primary care provider (PCP), if your PCP is located in our provider database this encounter information will be shared with them immediately following your visit.  Consent: (Patient) Brianna Stein provided verbal consent for this virtual visit at the beginning of the encounter.  Current Medications:  Current Outpatient Medications:    cyclobenzaprine (FLEXERIL) 10 MG tablet, Take 1 tablet (10 mg total) by mouth 2 (two) times daily as needed for muscle spasms., Disp: 30 tablet, Rfl: 0   docusate sodium (COLACE) 100 MG capsule, Take 1 capsule (100 mg total) by mouth 2 (two) times daily as needed for mild constipation., Disp: 30 capsule, Rfl: 1   ferrous sulfate (FERROUSUL) 325 (65 FE) MG tablet, Take 1 tablet (325 mg total) by mouth every other day for 40 doses., Disp: 40 tablet, Rfl: 0   Medications ordered in this encounter:  No orders of the defined types were placed in this encounter.    *If you need refills on other medications prior to your next appointment, please contact your pharmacy*  Follow-Up: Call back or seek an in-person evaluation if the symptoms worsen or if the condition fails to improve as anticipated.  [place] [facility] [phone]  Other Instructions Seek care in person at MAU ASAP   If you have been instructed to have an in-person evaluation today at a local Urgent Care facility, please use the link below. It will take you to a list of all of our available [facility], including address, phone number and hours of operation. Please do not delay care.  [facility]  If you or a family member do not have a primary care provider, use the link below to schedule a visit and establish care. When you choose a [facility] primary care physician or advanced practice provider, you gain a long-term partner in  health. Find a Primary Care Provider  Learn more about [facility]'s in-office and virtual care options: [facility]

## 2021-10-13 LAB — GC/CHLAMYDIA PROBE AMP (~~LOC~~) NOT AT ARMC
Chlamydia: NEGATIVE
Comment: NEGATIVE
Comment: NORMAL
Neisseria Gonorrhea: NEGATIVE

## 2021-10-15 ENCOUNTER — Encounter: Payer: Self-pay | Admitting: Obstetrics and Gynecology

## 2021-10-15 ENCOUNTER — Other Ambulatory Visit: Payer: Self-pay

## 2021-10-15 ENCOUNTER — Ambulatory Visit (INDEPENDENT_AMBULATORY_CARE_PROVIDER_SITE_OTHER): Payer: Medicaid Other | Admitting: Obstetrics and Gynecology

## 2021-10-15 VITALS — BP 122/73 | HR 100 | Wt 299.0 lb

## 2021-10-15 DIAGNOSIS — Z3A32 32 weeks gestation of pregnancy: Secondary | ICD-10-CM

## 2021-10-15 DIAGNOSIS — O0990 Supervision of high risk pregnancy, unspecified, unspecified trimester: Secondary | ICD-10-CM

## 2021-10-15 DIAGNOSIS — R8271 Bacteriuria: Secondary | ICD-10-CM

## 2021-10-15 DIAGNOSIS — O09893 Supervision of other high risk pregnancies, third trimester: Secondary | ICD-10-CM | POA: Diagnosis not present

## 2021-10-15 DIAGNOSIS — O0993 Supervision of high risk pregnancy, unspecified, third trimester: Secondary | ICD-10-CM

## 2021-10-15 DIAGNOSIS — Z641 Problems related to multiparity: Secondary | ICD-10-CM

## 2021-10-15 DIAGNOSIS — Z3009 Encounter for other general counseling and advice on contraception: Secondary | ICD-10-CM

## 2021-10-15 DIAGNOSIS — Z6841 Body Mass Index (BMI) 40.0 and over, adult: Secondary | ICD-10-CM

## 2021-10-15 MED ORDER — BETAMETHASONE SOD PHOS & ACET 6 (3-3) MG/ML IJ SUSP
12.0000 mg | INTRAMUSCULAR | Status: AC
  Administered 2021-10-15 – 2021-10-16 (×2): 12 mg via INTRAMUSCULAR

## 2021-10-16 ENCOUNTER — Ambulatory Visit (INDEPENDENT_AMBULATORY_CARE_PROVIDER_SITE_OTHER): Payer: Medicaid Other

## 2021-10-16 VITALS — BP 105/52 | HR 94 | Wt 299.2 lb

## 2021-10-16 DIAGNOSIS — O09893 Supervision of other high risk pregnancies, third trimester: Secondary | ICD-10-CM

## 2021-10-16 DIAGNOSIS — O0990 Supervision of high risk pregnancy, unspecified, unspecified trimester: Secondary | ICD-10-CM

## 2021-10-16 NOTE — Progress Notes (Signed)
Brianna Stein here for second Betamethasone Injection.  Injection administered without complication. Patient will return 10/29/21 for routine prenatal visit. Reviewed preterm labor precautions with patient.   [name], RN 10/16/2021  10:47 AM

## 2021-10-22 ENCOUNTER — Ambulatory Visit: Payer: Medicaid Other | Attending: Obstetrics and Gynecology

## 2021-10-22 ENCOUNTER — Other Ambulatory Visit: Payer: Self-pay | Admitting: *Deleted

## 2021-10-22 ENCOUNTER — Ambulatory Visit: Payer: Medicaid Other | Admitting: *Deleted

## 2021-10-22 VITALS — BP 125/70 | HR 99

## 2021-10-22 DIAGNOSIS — O0990 Supervision of high risk pregnancy, unspecified, unspecified trimester: Secondary | ICD-10-CM | POA: Diagnosis present

## 2021-10-22 DIAGNOSIS — Z641 Problems related to multiparity: Secondary | ICD-10-CM

## 2021-10-22 DIAGNOSIS — O368990 Maternal care for other specified fetal problems, unspecified trimester, not applicable or unspecified: Secondary | ICD-10-CM

## 2021-10-22 DIAGNOSIS — Z8751 Personal history of pre-term labor: Secondary | ICD-10-CM

## 2021-10-22 DIAGNOSIS — O403XX0 Polyhydramnios, third trimester, not applicable or unspecified: Secondary | ICD-10-CM

## 2021-10-22 DIAGNOSIS — O99213 Obesity complicating pregnancy, third trimester: Secondary | ICD-10-CM

## 2021-10-23 ENCOUNTER — Encounter (HOSPITAL_COMMUNITY): Payer: Self-pay | Admitting: Obstetrics and Gynecology

## 2021-10-23 ENCOUNTER — Inpatient Hospital Stay (HOSPITAL_COMMUNITY)
Admission: AD | Admit: 2021-10-23 | Discharge: 2021-10-23 | Disposition: A | Discharge status: Home / Self Care | Payer: Medicaid Other | Attending: Obstetrics and Gynecology | Admitting: Obstetrics and Gynecology

## 2021-10-23 DIAGNOSIS — O4700 False labor before 37 completed weeks of gestation, unspecified trimester: Secondary | ICD-10-CM | POA: Diagnosis not present

## 2021-10-23 DIAGNOSIS — O26893 Other specified pregnancy related conditions, third trimester: Secondary | ICD-10-CM | POA: Insufficient documentation

## 2021-10-23 DIAGNOSIS — O99213 Obesity complicating pregnancy, third trimester: Secondary | ICD-10-CM | POA: Insufficient documentation

## 2021-10-23 DIAGNOSIS — O09893 Supervision of other high risk pregnancies, third trimester: Secondary | ICD-10-CM | POA: Diagnosis not present

## 2021-10-23 DIAGNOSIS — Z3A34 34 weeks gestation of pregnancy: Secondary | ICD-10-CM | POA: Diagnosis not present

## 2021-10-23 DIAGNOSIS — O09213 Supervision of pregnancy with history of pre-term labor, third trimester: Secondary | ICD-10-CM | POA: Diagnosis not present

## 2021-10-23 LAB — TYPE AND SCREEN
ABO/RH(D): A POS
Antibody Screen: NEGATIVE

## 2021-10-23 LAB — CBC
HCT: 34.3 % — ABNORMAL LOW (ref 36.0–46.0)
Hemoglobin: 11.2 g/dL — ABNORMAL LOW (ref 12.0–15.0)
MCH: 27.8 pg (ref 26.0–34.0)
MCHC: 32.7 g/dL (ref 30.0–36.0)
MCV: 85.1 fL (ref 80.0–100.0)
Platelets: 260 10*3/uL (ref 150–400)
RBC: 4.03 MIL/uL (ref 3.87–5.11)
RDW: 13.1 % (ref 11.5–15.5)
WBC: 12 10*3/uL — ABNORMAL HIGH (ref 4.0–10.5)
nRBC: 0 % (ref 0.0–0.2)

## 2021-10-23 LAB — WET PREP, GENITAL
Sperm: NONE SEEN
Trich, Wet Prep: NONE SEEN
WBC, Wet Prep HPF POC: >=10 — AB (ref <10)

## 2021-10-23 MED ORDER — LACTATED RINGERS IV BOLUS
1000.0000 mL | Freq: Once | INTRAVENOUS | Status: AC
  Administered 2021-10-23: 1000 mL via INTRAVENOUS

## 2021-10-23 MED ORDER — NIFEDIPINE 10 MG PO CAPS: 10.0000 mg | ORAL_CAPSULE | ORAL | Status: DC | PRN

## 2021-10-23 NOTE — MAU Provider Note (Cosign Needed)
Patient Brianna Stein is a 31 y.o. [id number]  At [redacted]w[redacted]d here with complaints of abdominal pain that feels like contractions. The contractions started this morning. She declines LOF, dysuria, decreased fetal movements, VB. She has a pregnancy complicated by obesity and history of pre-term birth at 79 and 75 weeks.   She has a new diagnosis of polyhydramnios of 32 cm on 09/20.   History     CSN: [id number]  Arrival date and time: 10/23/21 2018   Event Date/Time   First Provider Initiated Contact with Patient 10/23/21 2102      Chief Complaint  Patient presents with   Contractions   Abdominal Pain This is a new problem. The current episode started today. The onset quality is sudden. The problem occurs constantly. The pain is located in the generalized abdominal region. Pertinent negatives include no constipation, diarrhea, dysuria, nausea or vomiting. Nothing aggravates the pain. The pain is relieved by Nothing.    OB History     Gravida  8   Para  6   Term  4   Preterm  2   AB  1   Living  6      SAB  1   IAB  0   Ectopic  0   Multiple  0   Live Births  6           Past Medical History:  Diagnosis Date   Asthma    BMI 50.0-59.9, adult ([place]) 04/28/2019    Past Surgical History:  Procedure Laterality Date   NO PAST SURGERIES      No family history on file.  Social History   Tobacco Use   Smoking status: Never   Smokeless tobacco: Never  Vaping Use   Vaping Use: Never used  Substance Use Topics   Alcohol use: Never   Drug use: Never    Allergies: No Known Allergies  Medications Prior to Admission  Medication Sig Dispense Refill Last Dose   ferrous sulfate 324 MG TBEC Take 324 mg by mouth daily with breakfast.   10/23/2021   cyclobenzaprine (FLEXERIL) 10 MG tablet Take 1 tablet (10 mg total) by mouth 2 (two) times daily as needed for muscle spasms. 30 tablet 0     Review of Systems  Constitutional: Negative.   HENT: Negative.     Respiratory: Negative.    Cardiovascular: Negative.   Gastrointestinal:  Positive for abdominal pain. Negative for constipation, diarrhea, nausea and vomiting.  Genitourinary: Negative.  Negative for dysuria.  Musculoskeletal: Negative.   Neurological: Negative.   Hematological: Negative.   Psychiatric/Behavioral: Negative.     Physical Exam   Blood pressure (!) 139/92, pulse 97, temperature (!) 97.2 F (36.2 C), resp. rate 18, last menstrual period 02/10/2021.  Physical Exam Constitutional:      Appearance: Normal appearance.  Pulmonary:     Effort: Pulmonary effort is normal.  Abdominal:     General: Abdomen is flat.  Musculoskeletal:        General: Normal range of motion.  Skin:    General: Skin is warm.  Neurological:     General: No focal deficit present.     Mental Status: She is alert.    MAU Course  Procedures  MDM -NST: 140 bpm, mod var, present acel, no decels, frequent short contractions -patient's cervix was 4.5, thick, middle position and ballotable at 2108 -patient declined terbutaline and procardia -wet prep shows yeast and flagyl  Reassessment (10:00 PM) -patient has not been able  to leave a urine sample yet  -patient care endorsed to Wynelle Bourgeois at 2223  Charlesetta Garibaldi Kooistra 10/23/2021, 9:15 PM   Assumed care: Rechecked cervix a third time and it was unchanged Dilation: 4 Effacement (%): 50 Cervical Position: Posterior Station: Ballotable Exam by:: M.Malikiah Debarr,CNM  Occasional contractions seen on tracing FHR reassuring Continues to decline tocolysis   A:  single IUP at [redacted]w[redacted]d       Preterm uterine contractions       History of preterm birth  P:  Discharge home      Preterm labor precautions      Followup in office      Encouraged to return if she develops worsening of symptoms, increase in pain, fever, or other concerning symptoms.   Aviva Signs, CNM

## 2021-10-23 NOTE — MAU Note (Signed)
Pt stated she did not want to take the procardia that was ordered to help stop her ctx. She stated that it made her sick the last time she was here. Notified K.Kooistra,CNM of pt decision. Pt was then offered terbutaline injection to help stop ctx.. Pt still did not want to take it. Stated she was told that the nixt time she came in they would not try to stop her labor and she doesn't want to stop it and then start having pain again. Provider notified of her decision.

## 2021-10-23 NOTE — MAU Note (Signed)
.  Brianna Stein is a 31 y.o. at [redacted]w[redacted]d here in MAU reporting: she has had ctx on and off all day. Now about 4-5 min aprt. Denies any vag bleeding or leaking. Good fetal movement felt. Stated she was dilated 4 cm 2 weeks ago. Was given som medicine to stop labor and sent home. (Steroids shot was given as well.  LMP:  Onset of complaint: this morning Pain score: 8 Vitals:   10/23/21 2047  BP: (!) 139/92  Pulse: 97  Resp: 18  Temp: (!) 97.2 F (36.2 C)     FHT:140 Lab orders placed from triage:  u/a ( unable to give sample)

## 2021-10-24 LAB — GC/CHLAMYDIA PROBE AMP (~~LOC~~) NOT AT ARMC
Chlamydia: NEGATIVE
Comment: NEGATIVE
Comment: NORMAL
Neisseria Gonorrhea: NEGATIVE

## 2021-10-24 LAB — RPR: RPR Ser Ql: NONREACTIVE

## 2021-10-29 ENCOUNTER — Encounter: Payer: Self-pay | Admitting: Obstetrics and Gynecology

## 2021-10-29 ENCOUNTER — Ambulatory Visit: Payer: Medicaid Other | Admitting: *Deleted

## 2021-10-29 ENCOUNTER — Encounter: Payer: Self-pay | Admitting: *Deleted

## 2021-10-29 ENCOUNTER — Other Ambulatory Visit: Payer: Self-pay

## 2021-10-29 ENCOUNTER — Ambulatory Visit (INDEPENDENT_AMBULATORY_CARE_PROVIDER_SITE_OTHER): Payer: Medicaid Other | Admitting: Obstetrics and Gynecology

## 2021-10-29 ENCOUNTER — Ambulatory Visit: Payer: Medicaid Other | Attending: Obstetrics and Gynecology

## 2021-10-29 VITALS — BP 110/66 | HR 85

## 2021-10-29 VITALS — BP 99/80 | HR 111 | Wt 290.6 lb

## 2021-10-29 DIAGNOSIS — Z3009 Encounter for other general counseling and advice on contraception: Secondary | ICD-10-CM

## 2021-10-29 DIAGNOSIS — O0990 Supervision of high risk pregnancy, unspecified, unspecified trimester: Secondary | ICD-10-CM | POA: Insufficient documentation

## 2021-10-29 DIAGNOSIS — O368990 Maternal care for other specified fetal problems, unspecified trimester, not applicable or unspecified: Secondary | ICD-10-CM | POA: Insufficient documentation

## 2021-10-29 DIAGNOSIS — O09893 Supervision of other high risk pregnancies, third trimester: Secondary | ICD-10-CM

## 2021-10-29 DIAGNOSIS — R8271 Bacteriuria: Secondary | ICD-10-CM

## 2021-10-29 DIAGNOSIS — O403XX0 Polyhydramnios, third trimester, not applicable or unspecified: Secondary | ICD-10-CM

## 2021-10-29 NOTE — Patient Instructions (Signed)

## 2021-10-29 NOTE — Progress Notes (Signed)
Subjective:  Brianna Stein is a 31 y.o. [id number] at [redacted]w[redacted]d being seen today for ongoing prenatal care.  She is currently monitored for the following issues for this high-risk pregnancy and has History of preterm delivery, currently pregnant in third trimester; BMI 50.0-59.9, adult ([place]); [place] multiparity; Supervision of high risk pregnancy, antepartum; Unwanted fertility; GBS bacteriuria; Obesity in pregnancy, antepartum, third trimester; and Polyhydramnios affecting pregnancy in third trimester on their problem list.  Patient reports general discomforts of pregnancy.  Contractions: Irritability. Vag. Bleeding: None.  Movement: Present. Denies leaking of fluid.   The following portions of the patient's history were reviewed and updated as appropriate: allergies, current medications, past family history, past medical history, past social history, past surgical history and problem list. Problem list updated.  Objective:   Vitals:   10/29/21 1121  BP: 99/80  Pulse: (!) 111  Weight: 290 lb 9.6 oz (131.8 kg)    Fetal Status: Fetal Heart Rate (bpm): 145   Movement: Present     General:  Alert, oriented and cooperative. Patient is in no acute distress.  Skin: Skin is warm and dry. No rash noted.   Cardiovascular: Normal heart rate noted  Respiratory: Normal respiratory effort, no problems with respiration noted  Abdomen: Soft, gravid, appropriate for gestational age. Pain/Pressure: Present     Pelvic:  Cervical exam performed        Extremities: Normal range of motion.     Mental Status: Normal mood and affect. Normal behavior. Normal judgment and thought content.   Urinalysis:      Assessment and Plan:  Pregnancy: [id number] at [redacted]w[redacted]d  1. Supervision of high risk pregnancy, antepartum Stable Labor precautions GC/C vis urine at next visit  2. GBS bacteriuria Tx while in labor  3. Polyhydramnios affecting pregnancy in third trimester Serial growth scans and antenatal testing as per  protocol  4. History of preterm delivery, currently pregnant in third trimester Stable  5. Unwanted fertility BTL papers signed Pt advised to ask if attending in house would be able to preform PP BTL   Preterm labor symptoms and general obstetric precautions including but not limited to vaginal bleeding, contractions, leaking of fluid and fetal movement were reviewed in detail with the patient. Please refer to After Visit Summary for other counseling recommendations.  Return in about 1 week (around 11/05/2021).   Chancy Milroy, MD

## 2021-11-05 ENCOUNTER — Inpatient Hospital Stay (HOSPITAL_COMMUNITY)
Admission: AD | Admit: 2021-11-05 | Discharge: 2021-11-05 | Disposition: A | Discharge status: Home / Self Care | Payer: Medicaid Other | Attending: Family Medicine | Admitting: Family Medicine

## 2021-11-05 DIAGNOSIS — O0990 Supervision of high risk pregnancy, unspecified, unspecified trimester: Secondary | ICD-10-CM

## 2021-11-05 DIAGNOSIS — O09213 Supervision of pregnancy with history of pre-term labor, third trimester: Secondary | ICD-10-CM | POA: Insufficient documentation

## 2021-11-05 DIAGNOSIS — O403XX0 Polyhydramnios, third trimester, not applicable or unspecified: Secondary | ICD-10-CM | POA: Insufficient documentation

## 2021-11-05 DIAGNOSIS — Z3A35 35 weeks gestation of pregnancy: Secondary | ICD-10-CM | POA: Insufficient documentation

## 2021-11-05 DIAGNOSIS — O4703 False labor before 37 completed weeks of gestation, third trimester: Secondary | ICD-10-CM

## 2021-11-05 NOTE — MAU Note (Addendum)
...  Brianna Stein is a 31 y.o. at [redacted]w[redacted]d here in MAU reporting: CTX since 0400 this morning that are now every 2-3 minutes apart. She reports with every contraction she feels the need to push. Denies VB. She reports she has been urinating frequently and reports she urinates each hour "more than she can count." +FM.  Dr. Nehemiah Settle, DO, at bedside upon arrival to room.  GBS+ in urine. Polyhydramnios dx on 9/20.  Onset of complaint: 0400 this morning  Pain score:  10/10 lower abdomen 10/10 lower back  FHT: 145 initial external

## 2021-11-05 NOTE — MAU Provider Note (Signed)
History     CSN: [id number]  Arrival date and time: 11/05/21 1106   Event Date/Time   First Provider Initiated Contact with Patient 11/05/21 1124      Chief Complaint  Patient presents with   Contractions   HPI This is a 31yo [id number] at [redacted]w[redacted]d with a pregnancy complicated by BMI of 50, and polyhydramnios, history of preterm delivery at 35 and 36 weeks, GBS bacteriuria.  The patient has been evaluated 3 times over the past 3 to 4 weeks due to threatened preterm labor.  Patient received betamethasone on 9/25 and 9/26.  She has declined tocolytics or IV fluids due to diminished contractions due to wanting to be done with a pregnancy because of the amount of discomfort that she is in.  OB History     Gravida  8   Para  6   Term  4   Preterm  2   AB  1   Living  6      SAB  1   IAB  0   Ectopic  0   Multiple  0   Live Births  6           Past Medical History:  Diagnosis Date   Asthma    BMI 50.0-59.9, adult ([facility]) 04/28/2019    Past Surgical History:  Procedure Laterality Date   NO PAST SURGERIES      No family history on file.  Social History   Tobacco Use   Smoking status: Never   Smokeless tobacco: Never  Vaping Use   Vaping Use: Never used  Substance Use Topics   Alcohol use: Never   Drug use: Never    Allergies: No Known Allergies  Medications Prior to Admission  Medication Sig Dispense Refill Last Dose   cyclobenzaprine (FLEXERIL) 10 MG tablet Take 1 tablet (10 mg total) by mouth 2 (two) times daily as needed for muscle spasms. 30 tablet 0    ferrous sulfate 324 MG TBEC Take 324 mg by mouth daily with breakfast.       Review of Systems Physical Exam   Blood pressure (!) 120/53, pulse 91, temperature 97.7 F (36.5 C), temperature source Oral, resp. rate 19, height 5\' 1"  (1.549 m), last menstrual period 02/10/2021, SpO2 100 %.  Physical Exam Vitals and nursing note reviewed. Exam conducted with a chaperone present.   Constitutional:      Appearance: Normal appearance.  Cardiovascular:     Rate and Rhythm: Normal rate and regular rhythm.  Pulmonary:     Effort: Pulmonary effort is normal.     Breath sounds: Normal breath sounds.  Abdominal:     General: Abdomen is flat.     Palpations: Abdomen is soft.  Skin:    General: Skin is warm and dry.     Capillary Refill: Capillary refill takes less than 2 seconds.  Neurological:     General: No focal deficit present.     Mental Status: She is alert.  Psychiatric:        Mood and Affect: Mood normal.        Behavior: Behavior normal.        Thought Content: Thought content normal.        Judgment: Judgment normal.    Dilation: 4 Effacement (%): 70 Station: -3 Presentation: Vertex Exam by:: Dr. [email or web address], DO  MAU Course  Procedures NST:  Baseline: 130  Variability: moderate Accelerations: present  Decelerations: absent Contractions: 2-5 minutes  MDM  Assessment  and Plan   1. Supervision of high risk pregnancy, antepartum   2. Threatened premature labor in third trimester    No cervical change after over an hour. I discussed tocolytics again - patient declined. She states that she is so uncomfortable and "ready to have this baby". I warned against potential problems.  Return precautions given. Discharged to home.  Truett Mainland 11/05/2021, 11:27 AM

## 2021-11-05 NOTE — MAU Note (Signed)
Patient unable to leave a urine sample at this time. 

## 2021-11-06 ENCOUNTER — Ambulatory Visit: Payer: Medicaid Other | Admitting: *Deleted

## 2021-11-06 ENCOUNTER — Encounter (HOSPITAL_COMMUNITY): Payer: Self-pay | Admitting: Obstetrics & Gynecology

## 2021-11-06 ENCOUNTER — Inpatient Hospital Stay (HOSPITAL_COMMUNITY)
Admission: AD | Admit: 2021-11-06 | Discharge: 2021-11-06 | Disposition: A | Discharge status: Home / Self Care | Payer: Medicaid Other | Attending: Obstetrics & Gynecology | Admitting: Obstetrics & Gynecology

## 2021-11-06 ENCOUNTER — Other Ambulatory Visit: Payer: Self-pay | Admitting: Advanced Practice Midwife

## 2021-11-06 ENCOUNTER — Ambulatory Visit: Payer: Medicaid Other | Attending: Maternal & Fetal Medicine

## 2021-11-06 VITALS — BP 112/57 | HR 100

## 2021-11-06 DIAGNOSIS — Z8751 Personal history of pre-term labor: Secondary | ICD-10-CM | POA: Insufficient documentation

## 2021-11-06 DIAGNOSIS — O99213 Obesity complicating pregnancy, third trimester: Secondary | ICD-10-CM | POA: Insufficient documentation

## 2021-11-06 DIAGNOSIS — O403XX0 Polyhydramnios, third trimester, not applicable or unspecified: Secondary | ICD-10-CM | POA: Diagnosis not present

## 2021-11-06 DIAGNOSIS — Z3A36 36 weeks gestation of pregnancy: Secondary | ICD-10-CM

## 2021-11-06 DIAGNOSIS — O0943 Supervision of pregnancy with grand multiparity, third trimester: Secondary | ICD-10-CM

## 2021-11-06 DIAGNOSIS — O0990 Supervision of high risk pregnancy, unspecified, unspecified trimester: Secondary | ICD-10-CM

## 2021-11-06 DIAGNOSIS — O368930 Maternal care for other specified fetal problems, third trimester, not applicable or unspecified: Secondary | ICD-10-CM | POA: Diagnosis not present

## 2021-11-06 DIAGNOSIS — O09293 Supervision of pregnancy with other poor reproductive or obstetric history, third trimester: Secondary | ICD-10-CM

## 2021-11-06 DIAGNOSIS — Z3403 Encounter for supervision of normal first pregnancy, third trimester: Secondary | ICD-10-CM | POA: Diagnosis present

## 2021-11-06 DIAGNOSIS — O368990 Maternal care for other specified fetal problems, unspecified trimester, not applicable or unspecified: Secondary | ICD-10-CM | POA: Insufficient documentation

## 2021-11-06 DIAGNOSIS — Z641 Problems related to multiparity: Secondary | ICD-10-CM | POA: Diagnosis present

## 2021-11-06 LAB — URINALYSIS, ROUTINE W REFLEX MICROSCOPIC
Bilirubin Urine: NEGATIVE
Glucose, UA: NEGATIVE mg/dL
Hgb urine dipstick: NEGATIVE
Ketones, ur: NEGATIVE mg/dL
Nitrite: NEGATIVE
Protein, ur: 30 mg/dL — AB
Specific Gravity, Urine: 1.025 (ref 1.005–1.030)
pH: 5 (ref 5.0–8.0)

## 2021-11-06 NOTE — MAU Provider Note (Signed)
Ms. Brianna Stein is a [id number] at [redacted]w[redacted]d seen in MAU for labor. RN labor check, not seen by provider. SVE by RN Dilation: 4 Effacement (%): 70 Station: -3 Presentation: Vertex Exam by:: Cloretta Ned, RN   NST - FHR: 125 bpm / moderate variability / accels present / decels absent / TOCO: regular every 5-7 mins   Plan:  D/C home with labor precautions Keep scheduled appt with Dr. Aletha Halim on 11/12/2021  Lowry Ram, MD  11/06/2021 12:01 PM

## 2021-11-06 NOTE — MAU Note (Signed)
...  Brianna Stein is a 31 y.o. at [redacted]w[redacted]d here in MAU reporting: CTX since yesterday that have maintained being 5-6 minutes apart since being in MAU. Denies VB or LOF. +GM. GBS+. She reports she was told to come here per MFM.   Pain score:  7/10 lower abdomen 8/10 lower back  FHT: 145 initial external Lab orders placed from triage:  MAU Labor

## 2021-11-06 NOTE — MAU Note (Signed)
Patient verbalized that she did not want to come to MAU to be evaluated but is here because MFM recommended she do so. Patient reports she wishes to be discharged home and does not wish to stay for evaluation if her cervix is unchanged from yesterday. Patient came out of the restroom holding her urine sample and stated, " I think I know why I'm contracting. I'm dehydrated." Patient's urine sample a dark orange. RN informed the patient that dehydration can definitely contribute to contractions. RN asked the patient if she would be open to receiving IV fluids should her urine sample show that she is dehydrated. Patient reports she does not want any IV fluids or any medications. She reports she wants to just drink water by mouth. Patient has only drank sprite today.   Patient's cervix unchanged and is 470-3. No VB. Informed patient that this RN would call [facility] and see what they recommend.  Maryelizabeth Kaufmann, CNM, in department and showed CNM urine sample. CNM recommends sending urine for UA and calling [facility].   This RN then saw the patient fully dressed and walking down the hallway. RN asked patient where she was going and if she was okay. Patient stated she just wanted to go home since her cervix was unchanged. RN asked patient to wait until speaking with the MD regarding her plan of care and informed the patient that her request to go home would be communicated.   Dr. Gwendolyn Lima, MD, reports patient may be discharged.   At this time, the patient's husband came to the patient's room and was very upset and asked why she was being sent home if she has been here to MAU a total of 5 times for contractions and was instructed to come here per MFM. RN informed the patient's husband that the patient is requesting to go home. RN informed the patient's husband that the patient is refusing IV fluids and has previously refused tocolytic's/interventions for PTL. Patient's husband then stated the patient had  been admitted in previous pregnancy's for PTL and he did not understand. RN asked the patient and her husband if they would like to speak with a Doctor. Both declined. Patient walked out of room without signing d/c paper's and her husband followed after her.

## 2021-11-12 ENCOUNTER — Other Ambulatory Visit: Payer: Self-pay

## 2021-11-12 ENCOUNTER — Ambulatory Visit (INDEPENDENT_AMBULATORY_CARE_PROVIDER_SITE_OTHER): Payer: Medicaid Other | Admitting: Obstetrics and Gynecology

## 2021-11-12 ENCOUNTER — Other Ambulatory Visit (HOSPITAL_COMMUNITY)
Admission: RE | Admit: 2021-11-12 | Discharge: 2021-11-12 | Disposition: A | Discharge status: Home / Self Care | Payer: Medicaid Other | Source: Ambulatory Visit | Attending: Obstetrics and Gynecology | Admitting: Obstetrics and Gynecology

## 2021-11-12 VITALS — BP 134/76 | HR 94 | Wt 298.5 lb

## 2021-11-12 DIAGNOSIS — R8271 Bacteriuria: Secondary | ICD-10-CM | POA: Diagnosis present

## 2021-11-12 DIAGNOSIS — Z3009 Encounter for other general counseling and advice on contraception: Secondary | ICD-10-CM

## 2021-11-12 DIAGNOSIS — O403XX0 Polyhydramnios, third trimester, not applicable or unspecified: Secondary | ICD-10-CM

## 2021-11-12 DIAGNOSIS — Z3A36 36 weeks gestation of pregnancy: Secondary | ICD-10-CM | POA: Diagnosis present

## 2021-11-12 DIAGNOSIS — O99213 Obesity complicating pregnancy, third trimester: Secondary | ICD-10-CM

## 2021-11-12 DIAGNOSIS — Z6841 Body Mass Index (BMI) 40.0 and over, adult: Secondary | ICD-10-CM

## 2021-11-12 NOTE — Progress Notes (Signed)
   PRENATAL VISIT NOTE  Subjective:  Brianna Stein is a 31 y.o. [phone] at [redacted]w[redacted]d being seen today for ongoing prenatal care.  She is currently monitored for the following issues for this high-risk pregnancy and has History of preterm delivery, currently pregnant in third trimester; BMI 50.0-59.9, adult (Brianna Stein); Brianna Stein multiparity; Supervision of high risk pregnancy, antepartum; Unwanted fertility; GBS bacteriuria; Obesity in pregnancy, antepartum, third trimester; and Polyhydramnios affecting pregnancy in third trimester on their problem list.  Patient reports occasional contractions and low belly and back pressure .  Contractions: Regular. Vag. Bleeding: None.  Movement: Present. Denies leaking of fluid.   The following portions of the patient's history were reviewed and updated as appropriate: allergies, current medications, past family history, past medical history, past social history, past surgical history and problem list.   Objective:   Vitals:   11/12/21 1015  BP: 134/76  Pulse: 94  Weight: 298 lb 8 oz (135.4 kg)    Fetal Status: Fetal Heart Rate (bpm): 140   Movement: Present     General:  Alert, oriented and cooperative. Patient is in no acute distress.  Skin: Skin is warm and dry. No rash noted.   Cardiovascular: Normal heart rate noted  Respiratory: Normal respiratory effort, no problems with respiration noted  Abdomen: Soft, gravid, appropriate for gestational age.  Pain/Pressure: Present     Pelvic: Cervical exam performed in the presence of a chaperone Dilation: 3.5 Effacement (%): 50 Station: Ballotable  Extremities: Normal range of motion.     Mental Status: Normal mood and affect. Normal behavior. Normal judgment and thought content.   Assessment and Plan:  Pregnancy: [id number] at [redacted]w[redacted]d 1. GBS bacteriuria - Cervicovaginal ancillary only( [place])  2. [redacted] weeks gestation of pregnancy Cx exam stable - Cervicovaginal ancillary only( [place])  3. BMI 50.0-59.9,  adult ([place])  4. Obesity in pregnancy, antepartum, third trimester 8lbs twg this pregnancy F/u qwk bpp tomorrow Set up 39-40wk iol next visit  81/01: cephalic, afi 18, 75% 1025EN, ac >99%  5. Unwanted fertility Btl papers signed on 9/11. May want interval btl and/or post placental iud. Pt aware that decision to do pp btl surgeon's discretion  6. Polyhydramnios affecting pregnancy in third trimester Normal last week  Term labor symptoms and general obstetric precautions including but not limited to vaginal bleeding, contractions, leaking of fluid and fetal movement were reviewed in detail with the patient. Please refer to After Visit Summary for other counseling recommendations.   No follow-ups on file.  Future Appointments  Date Time Provider Ward  11/13/2021  8:30 AM [facility] NURSE [facility]  11/13/2021  8:45 AM [facility] US5 [facility]  11/19/2021 11:15 AM Aletha Halim, MD [facility]  11/20/2021  8:45 AM [facility] NURSE [facility]  11/20/2021  9:00 AM [facility] US1 [facility]  11/26/2021 10:15 AM Aletha Halim, MD [facility]  11/27/2021 10:30 AM [facility] NURSE [facility]  11/27/2021 10:45 AM [facility] US5 [facility]  12/03/2021 10:15 AM Anyanwu, Sallyanne Havers, MD [facility]  12/10/2021 10:15 AM Chancy Milroy, MD [facility]  12/10/2021 11:15 AM [facility] NST [facility]    Aletha Halim, MD

## 2021-11-13 ENCOUNTER — Ambulatory Visit: Payer: Medicaid Other | Attending: Maternal & Fetal Medicine

## 2021-11-13 ENCOUNTER — Ambulatory Visit: Payer: Medicaid Other

## 2021-11-13 LAB — CERVICOVAGINAL ANCILLARY ONLY
Chlamydia: NEGATIVE
Comment: NEGATIVE
Comment: NORMAL
Neisseria Gonorrhea: NEGATIVE

## 2021-11-14 ENCOUNTER — Encounter: Payer: Self-pay | Admitting: Obstetrics and Gynecology

## 2021-11-19 ENCOUNTER — Other Ambulatory Visit: Payer: Self-pay

## 2021-11-19 ENCOUNTER — Ambulatory Visit (INDEPENDENT_AMBULATORY_CARE_PROVIDER_SITE_OTHER): Payer: Medicaid Other | Admitting: Obstetrics and Gynecology

## 2021-11-19 VITALS — BP 114/79 | HR 101 | Wt 298.1 lb

## 2021-11-19 DIAGNOSIS — R8271 Bacteriuria: Secondary | ICD-10-CM

## 2021-11-19 DIAGNOSIS — O99213 Obesity complicating pregnancy, third trimester: Secondary | ICD-10-CM

## 2021-11-19 DIAGNOSIS — Z3A37 37 weeks gestation of pregnancy: Secondary | ICD-10-CM

## 2021-11-19 NOTE — Progress Notes (Signed)
    PRENATAL VISIT NOTE  Subjective:  Brianna Stein is a 31 y.o. [phone] at [redacted]w[redacted]d being seen today for ongoing prenatal care.  She is currently monitored for the following issues for this high-risk pregnancy and has History of preterm delivery, currently pregnant in third trimester; BMI 50.0-59.9, adult ([place]); [place] multiparity; Supervision of high risk pregnancy, antepartum; Unwanted fertility; GBS bacteriuria; Obesity in pregnancy, antepartum, third trimester; and Polyhydramnios affecting pregnancy in third trimester on their problem list.  Patient reports she states she had two mild range BPs at home in the 140s/90s. Chronic, intermittent fatigue, LE edema, headache  Contractions: Irritability. Vag. Bleeding: None.  Movement: Present. Denies leaking of fluid.   The following portions of the patient's history were reviewed and updated as appropriate: allergies, current medications, past family history, past medical history, past social history, past surgical history and problem list.   Objective:   Vitals:   11/19/21 1119  BP: 114/79  Pulse: (!) 101  Weight: 298 lb 1.6 oz (135.2 kg)    Fetal Status: Fetal Heart Rate (bpm): 143   Movement: Present     General:  Alert, oriented and cooperative. Patient is in no acute distress.  Skin: Skin is warm and dry. No rash noted.   Cardiovascular: Normal heart rate noted  Respiratory: Normal respiratory effort, no problems with respiration noted  Abdomen: Soft, gravid, appropriate for gestational age.  Pain/Pressure: Present     Pelvic: Cervical exam deferred        Extremities: Normal range of motion.  Edema: Mild pitting, slight indentation  Mental Status: Normal mood and affect. Normal behavior. Normal judgment and thought content.   Assessment and Plan:  Pregnancy: [id number] at [redacted]w[redacted]d 1. GBS bacteriuria   2. [redacted] weeks gestation of pregnancy MAU, pre-eclampsia precautions given and pt told to check qday and if elevated, rpt in 19m and if  still elevated to come to [facility] for evaluation.   3. BMI 50.0-59.9, adult ([place])   4. Obesity in pregnancy, antepartum, third trimester F/u qwk bpp tomorrow Pt set up for 39wk IOL 10/25: cephalic, afi 18, 85% [id number], ac >99%   5. Unwanted fertility Btl papers signed on 9/11. May want interval btl and/or post placental iud. Pt aware that decision to do pp btl surgeon's discretion   6. Polyhydramnios affecting pregnancy in third trimester Normal last week   Term labor symptoms and general obstetric precautions including but not limited to vaginal bleeding, contractions, leaking of fluid and fetal movement were reviewed in detail with the patient. Please refer to After Visit Summary for other counseling recommendations.   Return in about 1 week (around 11/26/2021) for in person, md or app, high risk ob.  Future Appointments  Date Time Provider [place]  11/20/2021  8:45 AM [facility] NURSE [facility]  11/20/2021  9:00 AM [facility] US1 [facility]  11/26/2021 10:15 AM Aletha Halim, MD [facility]  11/27/2021  7:15 AM MC-LD SCHED ROOM [facility]-INDC None  11/27/2021 10:30 AM [facility] NURSE [facility]  11/27/2021 10:45 AM [facility] US5 [facility]  12/03/2021 10:15 AM Anyanwu, Sallyanne Havers, MD [facility]  12/10/2021 10:15 AM Chancy Milroy, MD [facility]  12/10/2021 11:15 AM [facility] NST [facility]    Aletha Halim, MD

## 2021-11-20 ENCOUNTER — Ambulatory Visit: Payer: Medicaid Other

## 2021-11-20 ENCOUNTER — Telehealth (HOSPITAL_COMMUNITY): Payer: Self-pay | Admitting: *Deleted

## 2021-11-20 ENCOUNTER — Encounter (HOSPITAL_COMMUNITY): Payer: Self-pay | Admitting: *Deleted

## 2021-11-20 NOTE — Telephone Encounter (Signed)
Preadmission screen  

## 2021-11-23 ENCOUNTER — Other Ambulatory Visit: Payer: Self-pay | Admitting: Advanced Practice Midwife

## 2021-11-26 ENCOUNTER — Encounter: Payer: Medicaid Other | Admitting: Obstetrics and Gynecology

## 2021-11-27 ENCOUNTER — Other Ambulatory Visit: Payer: Self-pay

## 2021-11-27 ENCOUNTER — Ambulatory Visit: Payer: Medicaid Other

## 2021-11-27 ENCOUNTER — Encounter (HOSPITAL_COMMUNITY): Payer: Self-pay | Admitting: Obstetrics and Gynecology

## 2021-11-27 ENCOUNTER — Inpatient Hospital Stay (HOSPITAL_COMMUNITY): Payer: Medicaid Other | Admitting: Anesthesiology

## 2021-11-27 ENCOUNTER — Inpatient Hospital Stay (HOSPITAL_COMMUNITY)
Admission: AD | Admit: 2021-11-27 | Discharge: 2021-11-28 | DRG: 807 | Disposition: A | Discharge status: Home / Self Care | Payer: Medicaid Other | Attending: Family Medicine | Admitting: Family Medicine

## 2021-11-27 ENCOUNTER — Other Ambulatory Visit: Payer: Medicaid Other

## 2021-11-27 ENCOUNTER — Inpatient Hospital Stay (HOSPITAL_COMMUNITY): Payer: Medicaid Other

## 2021-11-27 DIAGNOSIS — O0990 Supervision of high risk pregnancy, unspecified, unspecified trimester: Principal | ICD-10-CM

## 2021-11-27 DIAGNOSIS — Z3A39 39 weeks gestation of pregnancy: Secondary | ICD-10-CM

## 2021-11-27 DIAGNOSIS — Z3A37 37 weeks gestation of pregnancy: Secondary | ICD-10-CM

## 2021-11-27 DIAGNOSIS — O99214 Obesity complicating childbirth: Secondary | ICD-10-CM | POA: Diagnosis present

## 2021-11-27 DIAGNOSIS — O99824 Streptococcus B carrier state complicating childbirth: Secondary | ICD-10-CM | POA: Diagnosis present

## 2021-11-27 DIAGNOSIS — Z30430 Encounter for insertion of intrauterine contraceptive device: Secondary | ICD-10-CM | POA: Diagnosis not present

## 2021-11-27 DIAGNOSIS — E668 Other obesity: Secondary | ICD-10-CM | POA: Diagnosis not present

## 2021-11-27 DIAGNOSIS — O99213 Obesity complicating pregnancy, third trimester: Secondary | ICD-10-CM | POA: Diagnosis present

## 2021-11-27 LAB — CBC
HCT: 32.7 % — ABNORMAL LOW (ref 36.0–46.0)
Hemoglobin: 11.1 g/dL — ABNORMAL LOW (ref 12.0–15.0)
MCH: 27.8 pg (ref 26.0–34.0)
MCHC: 33.9 g/dL (ref 30.0–36.0)
MCV: 81.8 fL (ref 80.0–100.0)
Platelets: 225 10*3/uL (ref 150–400)
RBC: 4 MIL/uL (ref 3.87–5.11)
RDW: 13.8 % (ref 11.5–15.5)
WBC: 9.3 10*3/uL (ref 4.0–10.5)
nRBC: 0 % (ref 0.0–0.2)

## 2021-11-27 LAB — TYPE AND SCREEN
ABO/RH(D): A POS
Antibody Screen: NEGATIVE

## 2021-11-27 LAB — RPR: RPR Ser Ql: NONREACTIVE

## 2021-11-27 MED ORDER — OXYTOCIN BOLUS FROM INFUSION
333.0000 mL | Freq: Once | INTRAVENOUS | Status: AC
  Administered 2021-11-27: 333 mL via INTRAVENOUS

## 2021-11-27 MED ORDER — DIPHENHYDRAMINE HCL 50 MG/ML IJ SOLN: 12.5000 mg | INTRAMUSCULAR | Status: DC | PRN

## 2021-11-27 MED ORDER — ONDANSETRON HCL 4 MG/2ML IJ SOLN: 4.0000 mg | INTRAMUSCULAR | Status: DC | PRN

## 2021-11-27 MED ORDER — ACETAMINOPHEN 325 MG PO TABS: 650.0000 mg | ORAL_TABLET | ORAL | Status: DC | PRN

## 2021-11-27 MED ORDER — SODIUM CHLORIDE 0.9 % IV SOLN
5.0000 10*6.[IU] | Freq: Once | INTRAVENOUS | Status: AC
  Administered 2021-11-27: 5 10*6.[IU] via INTRAVENOUS
  Filled 2021-11-27: qty 5

## 2021-11-27 MED ORDER — LACTATED RINGERS IV SOLN: INTRAVENOUS | Status: DC

## 2021-11-27 MED ORDER — SENNOSIDES-DOCUSATE SODIUM 8.6-50 MG PO TABS
2.0000 | ORAL_TABLET | Freq: Every day | ORAL | Status: DC
  Administered 2021-11-28: 2 via ORAL
  Filled 2021-11-27: qty 2

## 2021-11-27 MED ORDER — IBUPROFEN 600 MG PO TABS
600.0000 mg | ORAL_TABLET | Freq: Four times a day (QID) | ORAL | Status: DC
  Administered 2021-11-27 – 2021-11-28 (×4): 600 mg via ORAL
  Filled 2021-11-27 (×4): qty 1

## 2021-11-27 MED ORDER — PENICILLIN G POT IN DEXTROSE 60000 UNIT/ML IV SOLN
3.0000 10*6.[IU] | INTRAVENOUS | Status: DC
  Administered 2021-11-27: 3 10*6.[IU] via INTRAVENOUS
  Filled 2021-11-27: qty 50

## 2021-11-27 MED ORDER — BENZOCAINE-MENTHOL 20-0.5 % EX AERO: 1.0000 | INHALATION_SPRAY | CUTANEOUS | Status: DC | PRN

## 2021-11-27 MED ORDER — PHENYLEPHRINE 80 MCG/ML (10ML) SYRINGE FOR IV PUSH (FOR BLOOD PRESSURE SUPPORT)
80.0000 ug | PREFILLED_SYRINGE | INTRAVENOUS | Status: DC | PRN
  Filled 2021-11-27: qty 10

## 2021-11-27 MED ORDER — PRENATAL MULTIVITAMIN CH
1.0000 | ORAL_TABLET | Freq: Every day | ORAL | Status: DC
  Filled 2021-11-27: qty 1

## 2021-11-27 MED ORDER — WITCH HAZEL-GLYCERIN EX PADS: 1.0000 | MEDICATED_PAD | CUTANEOUS | Status: DC | PRN

## 2021-11-27 MED ORDER — ONDANSETRON HCL 4 MG/2ML IJ SOLN
4.0000 mg | Freq: Four times a day (QID) | INTRAMUSCULAR | Status: DC | PRN
  Administered 2021-11-27 (×2): 4 mg via INTRAVENOUS
  Filled 2021-11-27 (×2): qty 2

## 2021-11-27 MED ORDER — LIDOCAINE HCL (PF) 1 % IJ SOLN
INTRAMUSCULAR | Status: DC | PRN
  Administered 2021-11-27: 2 mL via EPIDURAL
  Administered 2021-11-27: 10 mL via EPIDURAL

## 2021-11-27 MED ORDER — EPHEDRINE 5 MG/ML INJ: 10.0000 mg | INTRAVENOUS | Status: DC | PRN

## 2021-11-27 MED ORDER — PHENYLEPHRINE 80 MCG/ML (10ML) SYRINGE FOR IV PUSH (FOR BLOOD PRESSURE SUPPORT)
80.0000 ug | PREFILLED_SYRINGE | INTRAVENOUS | Status: DC | PRN
  Administered 2021-11-27: 80 ug via INTRAVENOUS

## 2021-11-27 MED ORDER — LEVONORGESTREL 20 MCG/DAY IU IUD
1.0000 | INTRAUTERINE_SYSTEM | Freq: Once | INTRAUTERINE | Status: AC
  Administered 2021-11-27: 1 via INTRAUTERINE
  Filled 2021-11-27: qty 1

## 2021-11-27 MED ORDER — TRANEXAMIC ACID-NACL 1000-0.7 MG/100ML-% IV SOLN
1000.0000 mg | INTRAVENOUS | Status: AC
  Administered 2021-11-27: 1000 mg via INTRAVENOUS

## 2021-11-27 MED ORDER — TRANEXAMIC ACID-NACL 1000-0.7 MG/100ML-% IV SOLN
INTRAVENOUS | Status: AC
  Filled 2021-11-27: qty 100

## 2021-11-27 MED ORDER — FENTANYL-BUPIVACAINE-NACL 0.5-0.125-0.9 MG/250ML-% EP SOLN
12.0000 mL/h | EPIDURAL | Status: DC | PRN
  Filled 2021-11-27: qty 250

## 2021-11-27 MED ORDER — ONDANSETRON HCL 4 MG PO TABS: 4.0000 mg | ORAL_TABLET | ORAL | Status: DC | PRN

## 2021-11-27 MED ORDER — TERBUTALINE SULFATE 1 MG/ML IJ SOLN: 0.2500 mg | Freq: Once | INTRAMUSCULAR | Status: DC | PRN

## 2021-11-27 MED ORDER — ACETAMINOPHEN 325 MG PO TABS
650.0000 mg | ORAL_TABLET | ORAL | Status: DC | PRN
  Administered 2021-11-27: 650 mg via ORAL
  Filled 2021-11-27: qty 2

## 2021-11-27 MED ORDER — ZOLPIDEM TARTRATE 5 MG PO TABS: 5.0000 mg | ORAL_TABLET | Freq: Every evening | ORAL | Status: DC | PRN

## 2021-11-27 MED ORDER — FENTANYL-BUPIVACAINE-NACL 0.5-0.125-0.9 MG/250ML-% EP SOLN
EPIDURAL | Status: DC | PRN
  Administered 2021-11-27: 12 mL/h via EPIDURAL

## 2021-11-27 MED ORDER — DIPHENHYDRAMINE HCL 25 MG PO CAPS: 25.0000 mg | ORAL_CAPSULE | Freq: Four times a day (QID) | ORAL | Status: DC | PRN

## 2021-11-27 MED ORDER — LACTATED RINGERS IV SOLN: 500.0000 mL | INTRAVENOUS | Status: DC | PRN

## 2021-11-27 MED ORDER — DIBUCAINE (PERIANAL) 1 % EX OINT: 1.0000 | TOPICAL_OINTMENT | CUTANEOUS | Status: DC | PRN

## 2021-11-27 MED ORDER — TETANUS-DIPHTH-ACELL PERTUSSIS 5-2.5-18.5 LF-MCG/0.5 IM SUSY: 0.5000 mL | PREFILLED_SYRINGE | Freq: Once | INTRAMUSCULAR | Status: DC

## 2021-11-27 MED ORDER — SIMETHICONE 80 MG PO CHEW: 80.0000 mg | CHEWABLE_TABLET | ORAL | Status: DC | PRN

## 2021-11-27 MED ORDER — FERROUS SULFATE 325 (65 FE) MG PO TABS
325.0000 mg | ORAL_TABLET | Freq: Two times a day (BID) | ORAL | Status: DC
  Administered 2021-11-27 – 2021-11-28 (×2): 325 mg via ORAL
  Filled 2021-11-27 (×2): qty 1

## 2021-11-27 MED ORDER — COCONUT OIL OIL: 1.0000 | TOPICAL_OIL | Status: DC | PRN

## 2021-11-27 MED ORDER — ERYTHROMYCIN 5 MG/GM OP OINT
TOPICAL_OINTMENT | OPHTHALMIC | Status: AC
  Filled 2021-11-27: qty 1

## 2021-11-27 MED ORDER — LACTATED RINGERS IV SOLN: 500.0000 mL | Freq: Once | INTRAVENOUS | Status: DC

## 2021-11-27 MED ORDER — SOD CITRATE-CITRIC ACID 500-334 MG/5ML PO SOLN: 30.0000 mL | ORAL | Status: DC | PRN

## 2021-11-27 MED ORDER — OXYTOCIN-SODIUM CHLORIDE 30-0.9 UT/500ML-% IV SOLN
1.0000 m[IU]/min | INTRAVENOUS | Status: DC
  Administered 2021-11-27: 2 m[IU]/min via INTRAVENOUS

## 2021-11-27 MED ORDER — LIDOCAINE HCL (PF) 1 % IJ SOLN: 30.0000 mL | INTRAMUSCULAR | Status: DC | PRN

## 2021-11-27 MED ORDER — OXYTOCIN-SODIUM CHLORIDE 30-0.9 UT/500ML-% IV SOLN
2.5000 [IU]/h | INTRAVENOUS | Status: DC
  Administered 2021-11-27: 2.5 [IU]/h via INTRAVENOUS
  Filled 2021-11-27: qty 500

## 2021-11-27 MED ORDER — FENTANYL CITRATE (PF) 100 MCG/2ML IJ SOLN
100.0000 ug | INTRAMUSCULAR | Status: DC | PRN
  Administered 2021-11-27: 100 ug via INTRAVENOUS
  Filled 2021-11-27: qty 2

## 2021-11-27 NOTE — Progress Notes (Signed)
Brianna Stein is a 31 y.o. G2R4270 at [redacted]w[redacted]d by 6 wks ultrasound admitted for induction of labor due to BMI 50.0-59.9, polyhydramnios (resolved) and [place] multiparity.  Subjective: Patient is comfortable with epidural with supportive FOB and mother at bedside. She desires for AROM to augment her induction at this time.  Objective: BP 122/71   Pulse 71   Temp 97.6 F (36.4 C) (Oral)   Resp 18   Ht 5\' 1"  (1.549 m)   Wt 134.4 kg   LMP 02/10/2021   SpO2 99%   BMI 55.99 kg/m  No intake/output data recorded. No intake/output data recorded.  FHT:  FHR: 105 bpm, variability: moderate,  accelerations:  Present,  decelerations:  Absent UC:   regular, every 2-3 minutes SVE:   Dilation: 6.5 Effacement (%): 90 Station: -1; well-applied to cervix Exam by:: Laury Deep CNM AROM with moderate amount of clear fluid  Labs: Lab Results  Component Value Date   WBC 9.3 11/27/2021   HGB 11.1 (L) 11/27/2021   HCT 32.7 (L) 11/27/2021   MCV 81.8 11/27/2021   PLT 225 11/27/2021    Assessment / Plan: Induction of labor due to BMI 50.0-59.9,  progressing well on pitocin  Labor: Progressing on Pitocin, will continue to increase since AROM Preeclampsia:   n/a Fetal Wellbeing:  Category I Pain Control:  Epidural I/D:   GBS Pos Anticipated MOD:  NSVD   Dr. Nehemiah Settle assessed and discussed contraception options with patient. Patient decided to have PP IUD (Mirena) placed until her interval BTL could be scheduled.  Laury Deep, CNM 11/27/2021, 1:47 PM

## 2021-11-27 NOTE — H&P (Cosign Needed Addendum)
OBSTETRIC ADMISSION HISTORY AND PHYSICAL  Brianna Stein is a 31 y.o. female [phone] with IUP at [redacted]w[redacted]d by 6wk [place] presenting for IOL for BMI 50.0-59.9, polyhydramnios (resolved), grand multiparity and high risk pregnancy. She reports +FMs, No LOF, no VB, and no RUQ pain.  The patient reports that she had some blurry vision towards the end of her pregnancy, however this has resolved. She complains of a mild intermittent dull headache and minimal peripheral edema. She plans on bottle feeding. She request tubal ligation or IUD for birth control. She received her prenatal care at [facility]  Dating: By 6wk [place] --->  Estimated Date of Delivery: 12/04/21  Sono:    @[redacted]w[redacted]d , CWD, normal anatomy, cephalic presentation, 3116g, 6lb 14oz, 80% EFW   Prenatal History/Complications: GBS+, polyhydramnios (resoled), grand multiparity   Past Medical History: Past Medical History:  Diagnosis Date   Asthma    BMI 50.0-59.9, adult (HCC) 04/28/2019    Past Surgical History: Past Surgical History:  Procedure Laterality Date   NO PAST SURGERIES      Obstetrical History: OB History     Gravida  8   Para  6   Term  4   Preterm  2   AB  1   Living  6      SAB  1   IAB  0   Ectopic  0   Multiple  0   Live Births  6           Social History Social History   Socioeconomic History   Marital status: Married    Spouse name: Cadence Minton   Number of children: 5   Years of education: 12   Highest education level: High school graduate  Occupational History   Occupation: Stay Home Mom  Tobacco Use   Smoking status: Never   Smokeless tobacco: Never  Vaping Use   Vaping Use: Never used  Substance and Sexual Activity   Alcohol use: Never   Drug use: Never   Sexual activity: Yes  Other Topics Concern   Not on file  Social History Narrative   Not on file   Social Determinants of Health   Financial Resource Strain: Low Risk  (10/04/2020)   Overall Financial Resource Strain (CARDIA)     Difficulty of Paying Living Expenses: Not hard at all  Food Insecurity: No Food Insecurity (11/27/2021)   Hunger Vital Sign    Worried About Running Out of Food in the Last Year: Never true    Ran Out of Food in the Last Year: Never true  Transportation Needs: No Transportation Needs (11/27/2021)   PRAPARE - 13/07/2021 (Medical): No    Lack of Transportation (Non-Medical): No  Physical Activity: Insufficiently Active (10/04/2020)   Exercise Vital Sign    Days of Exercise per Week: 3 days    Minutes of Exercise per Session: 30 min  Stress: No Stress Concern Present (10/04/2020)   10/06/2020 of Occupational Health - Occupational Stress Questionnaire    Feeling of Stress : Not at all  Social Connections: Socially Integrated (10/04/2020)   Social Connection and Isolation Panel [NHANES]    Frequency of Communication with Friends and Family: More than three times a week    Frequency of Social Gatherings with Friends and Family: More than three times a week    Attends Religious Services: More than 4 times per year    Active Member of Clubs or Organizations: Yes    Attends  Music therapist: More than 4 times per year    Marital Status: Married    Family History: History reviewed. No pertinent family history.  Allergies: No Known Allergies  Medications Prior to Admission  Medication Sig Dispense Refill Last Dose   ferrous sulfate 324 MG TBEC Take 324 mg by mouth daily with breakfast. (Patient not taking: Reported on 11/12/2021)        Review of Systems   All systems reviewed and negative except as stated in HPI  Blood pressure (!) 142/94, pulse 88, temperature 98.3 F (36.8 C), temperature source Oral, resp. rate 18, height 5\' 1"  (1.549 m), weight 134.4 kg, last menstrual period 02/10/2021. General appearance: mild distress Lungs: clear to auscultation bilaterally Heart: regular rate and rhythm Abdomen: soft, non-tender; bowel  sounds normal Pelvic: Adequate Extremities: Homans sign is negative, no sign of DVT DTR's: Normal Presentation: cephalic Fetal monitoringBaseline: 125 bpm, Variability: Good {> 6 bpm), and Accelerations: Reactive Uterine activityFrequency: Every 3.5-5 minutes, Duration: 80-90 seconds, and Intensity: moderate Dilation: 4 Effacement (%): 80 Station: -3 Exam by:: Krystal Eaton RN Pitocin at 6 mili units/minute    Prenatal labs: ABO, Rh: --/--/PENDING (11/07 0744) Antibody: PENDING (11/07 0744) Rubella: 3.74 (05/03 1530) RPR: NON REACTIVE (10/03 2135)  HBsAg: Negative (05/03 1530)  HIV: Non Reactive (08/25 0856)  GBS: Positive/-- (07/28 0000)  1 hr Glucola -normal Genetic screening  - normal Anatomy US - minimal pericardial effusion seen (7mm) resolved?   Prenatal Transfer Tool  Maternal Diabetes: No Genetic Screening: Normal Maternal Ultrasounds/Referrals: Normal and Other: small pericardial effusion seen (68mm) ? Resolved, polyhydramnios resolved.  Fetal Ultrasounds or other Referrals:  Referred to Materal Fetal Medicine  h/o PTL and obesity. Maternal Substance Abuse:  No Significant Maternal Medications:  None Significant Maternal Lab Results: Group B Strep positive  Results for orders placed or performed during the hospital encounter of 11/27/21 (from the past 24 hour(s))  Type and screen   Collection Time: 11/27/21  7:44 AM  Result Value Ref Range   ABO/RH(D) PENDING    Antibody Screen PENDING    Sample Expiration      11/30/2021,2359 Performed at [facility], [place] [address], [place], [place] [address]   CBC   Collection Time: 11/27/21  7:50 AM  Result Value Ref Range   WBC 9.3 4.0 - 10.5 K/uL   RBC 4.00 3.87 - 5.11 MIL/uL   Hemoglobin 11.1 (L) 12.0 - 15.0 g/dL   HCT 32.7 (L) 36.0 - 46.0 %   MCV 81.8 80.0 - 100.0 fL   MCH 27.8 26.0 - 34.0 pg   MCHC 33.9 30.0 - 36.0 g/dL   RDW 13.8 11.5 - 15.5 %   Platelets 225 150 - 400 K/uL   nRBC 0.0 0.0 - 0.2 %     Patient Active Problem List   Diagnosis Date Noted   Obesity affecting pregnancy in third trimester 11/27/2021   Polyhydramnios affecting pregnancy in third trimester 10/11/2021   Obesity in pregnancy, antepartum, third trimester 10/01/2021   GBS bacteriuria 05/28/2021   Unwanted fertility 05/23/2021   Supervision of high risk pregnancy, antepartum 05/02/2021   Grand multiparity 04/30/2019   BMI 50.0-59.9, adult ([place]) 04/28/2019   History of preterm delivery, currently pregnant in third trimester 04/27/2019    Assessment/Plan:  Brianna Stein is a 31 y.o. [id number] at [redacted]w[redacted]d here for IOL for BMI 50.0-59.9, polyhydramnios (resolved), grand multiparity and high risk pregnancy.   #Labor: IOL - Pitocin  #Pain: Epidural.  #  FWB: Category 1. #ID:  GBS+ #MOF: Bottle feeding. #MOC:Tubal ligation or IUD. #Circ:  N/A  Georgiann Hahn, Medical Student  11/27/2021, 8:57 AM   Attestation of Supervision of Student:  I confirm that I have verified the information documented in the medical student's note and that I have also personally reperformed the history, physical exam and all medical decision making activities.  I have verified that all services and findings are accurately documented in this student's note; and I agree with management and plan as outlined in the documentation. I have also made any necessary editorial changes.  Orders given for epidural placement by maternal request. Plan to AROM after comfortable with epidural.  Raelyn Mora, CNM [facility] 11/27/2021 11:53 AM

## 2021-11-27 NOTE — Anesthesia Preprocedure Evaluation (Signed)
Anesthesia Evaluation  Patient identified by MRN, date of birth, ID band Patient awake    Reviewed: Allergy & Precautions, Patient's Chart, lab work & pertinent test results  Airway Mallampati: II  TM Distance: >3 FB Neck ROM: Full    Dental no notable dental hx.    Pulmonary asthma    Pulmonary exam normal breath sounds clear to auscultation       Cardiovascular negative cardio ROS Normal cardiovascular exam Rhythm:Regular Rate:Normal     Neuro/Psych negative neurological ROS  negative psych ROS   GI/Hepatic negative GI ROS, Neg liver ROS,,,  Endo/Other    Morbid obesityBMI 56  Renal/GU negative Renal ROS  negative genitourinary   Musculoskeletal negative musculoskeletal ROS (+)    Abdominal  (+) + obese  Peds negative pediatric ROS (+)  Hematology negative hematology ROS (+) Hb 11.1, plt 225   Anesthesia Other Findings   Reproductive/Obstetrics (+) Pregnancy                             Anesthesia Physical Anesthesia Plan  ASA: 3  Anesthesia Plan: Epidural   Post-op Pain Management:    Induction:   PONV Risk Score and Plan: 2  Airway Management Planned: Natural Airway  Additional Equipment: None  Intra-op Plan:   Post-operative Plan:   Informed Consent: I have reviewed the patients History and Physical, chart, labs and discussed the procedure including the risks, benefits and alternatives for the proposed anesthesia with the patient or authorized representative who has indicated his/her understanding and acceptance.       Plan Discussed with:   Anesthesia Plan Comments:        Anesthesia Quick Evaluation

## 2021-11-27 NOTE — Discharge Summary (Signed)
Postpartum Discharge Summary  Date of Service updated***     Patient Name: Brianna Stein DOB: 04-21-1990 MRN: [id number]  Date of admission: 11/27/2021 Delivery date:11/27/2021  Delivering provider: Laury Deep  Date of discharge: 11/27/2021  Admitting diagnosis: Obesity affecting pregnancy in third trimester [O99.213] Intrauterine pregnancy: [redacted]w[redacted]d    Secondary diagnosis:  Principal Problem:   Obesity affecting pregnancy in third trimester  Additional problems: post placental IUD    Discharge diagnosis: Term Pregnancy Delivered                                              Post partum procedures:{Postpartum procedures:23558} Augmentation: AROM and Pitocin Complications: None  Hospital course: Induction of Labor With Vaginal Delivery   31y.o. yo [id number] admitted to the hospital 11/27/2021 for induction of labor.  Indication for induction:  high BMI, polyhydramnios (resolved) .  Patient had an labor course complicated by none Membrane Rupture Time/Date: 12:35 PM ,11/27/2021   Delivery Method:Vaginal, Spontaneous  Episiotomy: None  Lacerations:  None  Details of delivery can be found in separate delivery note.  Patient had a postpartum course complicated by none. Patient is discharged home 11/27/21.  Newborn Data: Birth date:11/27/2021  Birth time:3:12 PM  Gender:Female  Living status:Living  Apgars:8 ,9  Weight:   Magnesium Sulfate received: No BMZ received: Yes Rhophylac:N/A MMR:No T-DaP:Given prenatally Flu: No- declined Transfusion:{Transfusion received:30440034}  Physical exam  Vitals:   11/27/21 1501 11/27/21 1519 11/27/21 1532 11/27/21 1545  BP: (!) 144/67 107/72 107/68 111/78  Pulse: 75 76 74 (!) 160  Resp:      Temp:      TempSrc:      SpO2: 100%     Weight:      Height:       General: {Exam; general:21111117} Lochia: {Desc; appropriate/inappropriate:30686::"appropriate"} Uterine Fundus: {Desc; firm/soft:30687} Incision: {Exam;  incision:21111123} DVT Evaluation: {Exam; dvt:2111122} Labs: Lab Results  Component Value Date   WBC 9.3 11/27/2021   HGB 11.1 (L) 11/27/2021   HCT 32.7 (L) 11/27/2021   MCV 81.8 11/27/2021   PLT 225 11/27/2021      Latest Ref Rng & Units 10/08/2018   10:32 AM  CMP  Glucose 70 - 99 mg/dL 88   BUN 6 - 20 mg/dL 8   Creatinine 0.44 - 1.00 mg/dL 0.72   Sodium 135 - 145 mmol/L 135   Potassium 3.5 - 5.1 mmol/L 4.1   Chloride 98 - 111 mmol/L 107   CO2 22 - 32 mmol/L 22   Calcium 8.9 - 10.3 mg/dL 8.9   Total Protein 6.5 - 8.1 g/dL 7.2   Total Bilirubin 0.3 - 1.2 mg/dL 0.6   Alkaline Phos 38 - 126 U/L 57   AST 15 - 41 U/L 15   ALT 0 - 44 U/L 18    Edinburgh Score:    06/09/2019    1:14 PM  Edinburgh Postnatal Depression Scale Screening Tool  I have been able to laugh and see the funny side of things. 0  I have looked forward with enjoyment to things. 0  I have blamed myself unnecessarily when things went wrong. 0  I have been anxious or worried for no good reason. 0  I have felt scared or panicky for no good reason. 0  Things have been getting on top of me. 1  I  have been so unhappy that I have had difficulty sleeping. 0  I have felt sad or miserable. 0  I have been so unhappy that I have been crying. 0  The thought of harming myself has occurred to me. 0  Edinburgh Postnatal Depression Scale Total 1     After visit meds:  Allergies as of 11/27/2021   No Known Allergies   Med Rec must be completed prior to using this [facility]***        Discharge home in stable condition Infant Feeding: Bottle Infant Disposition:{CHL IP OB HOME WITH EYEMVV:61224} Discharge instruction: per After Visit Summary and Postpartum booklet. Activity: Advance as tolerated. Pelvic rest for 6 weeks.  Diet: {OB SLPN:30051102} Future Appointments: Future Appointments  Date Time Provider [place]  12/03/2021 10:15 AM Osborne Oman, MD [facility]  12/10/2021 10:15 AM Chancy Milroy, MD [facility]  12/10/2021 11:15 AM [facility] [place]   Follow up Visit:  [place] [facility] Follow up.   Specialty: Obstetrics and Gynecology Why: postpartum visit Contact information: [place] [phone]                Message sent to [facility] on 11/27/2021 by R. Renato Battles, CNM Please schedule this patient for a In person postpartum visit in 4 weeks with the following provider: MD. Additional Postpartum F/U: IUD string check and schedule PP BTL   High risk pregnancy complicated by: BMI, polyhydramnios Delivery mode:  Vaginal, Spontaneous  Anticipated Birth Control:  PP IUD placed - planning PP BTL at 6 wks PP   11/27/2021 Laury Deep, CNM

## 2021-11-27 NOTE — Anesthesia Procedure Notes (Signed)
Epidural Patient location during procedure: OB Start time: 11/27/2021 10:04 AM End time: 11/27/2021 10:10 AM  Staffing Anesthesiologist: Pervis Hocking, DO Performed: anesthesiologist   Preanesthetic Checklist Completed: patient identified, IV checked, risks and benefits discussed, monitors and equipment checked, pre-op evaluation and timeout performed  Epidural Patient position: sitting Prep: DuraPrep and site prepped and draped Patient monitoring: continuous pulse ox, blood pressure, heart rate and cardiac monitor Approach: midline Location: L3-L4 Injection technique: LOR air  Needle:  Needle type: Tuohy  Needle gauge: 17 G Needle length: 9 cm Needle insertion depth: 8 cm Catheter type: closed end flexible Catheter size: 19 Gauge Catheter at skin depth: 14 cm Test dose: negative  Assessment Sensory level: T8 Events: blood not aspirated, injection not painful, no injection resistance, no paresthesia and negative IV test  Additional Notes Patient identified. Risks/Benefits/Options discussed with patient including but not limited to bleeding, infection, nerve damage, paralysis, failed block, incomplete pain control, headache, blood pressure changes, nausea, vomiting, reactions to medication both or allergic, itching and postpartum back pain. Confirmed with bedside nurse the patient's most recent platelet count. Confirmed with patient that they are not currently taking any anticoagulation, have any bleeding history or any family history of bleeding disorders. Patient expressed understanding and wished to proceed. All questions were answered. Sterile technique was used throughout the entire procedure. Please see nursing notes for vital signs. Test dose was given through epidural catheter and negative prior to continuing to dose epidural or start infusion. Warning signs of high block given to the patient including shortness of breath, tingling/numbness in hands, complete motor  block, or any concerning symptoms with instructions to call for help. Patient was given instructions on fall risk and not to get out of bed. All questions and concerns addressed with instructions to call with any issues or inadequate analgesia.  Reason for block:procedure for pain

## 2021-11-28 LAB — CBC
HCT: 30 % — ABNORMAL LOW (ref 36.0–46.0)
Hemoglobin: 9.8 g/dL — ABNORMAL LOW (ref 12.0–15.0)
MCH: 27.4 pg (ref 26.0–34.0)
MCHC: 32.7 g/dL (ref 30.0–36.0)
MCV: 83.8 fL (ref 80.0–100.0)
Platelets: 219 10*3/uL (ref 150–400)
RBC: 3.58 MIL/uL — ABNORMAL LOW (ref 3.87–5.11)
RDW: 13.8 % (ref 11.5–15.5)
WBC: 11.8 10*3/uL — ABNORMAL HIGH (ref 4.0–10.5)
nRBC: 0 % (ref 0.0–0.2)

## 2021-11-28 MED ORDER — IBUPROFEN 600 MG PO TABS: 600.0000 mg | ORAL_TABLET | Freq: Four times a day (QID) | ORAL | 0 refills | Status: AC | PRN

## 2021-11-28 NOTE — Discharge Instructions (Signed)
NO SEX UNTIL AFTER YOUR POSTPARTUM VISIT   

## 2021-11-28 NOTE — Anesthesia Postprocedure Evaluation (Signed)
Anesthesia Post Note  Patient: Brianna Stein  Procedure(s) Performed: AN AD HOC LABOR EPIDURAL     Patient location during evaluation: Mother Baby Anesthesia Type: Epidural Level of consciousness: awake, oriented and awake and alert Pain management: pain level controlled Vital Signs Assessment: post-procedure vital signs reviewed and stable Respiratory status: spontaneous breathing, respiratory function stable and nonlabored ventilation Cardiovascular status: stable Postop Assessment: no headache, adequate PO intake, able to ambulate, patient able to bend at knees and no apparent nausea or vomiting Anesthetic complications: no   No notable events documented.  Last Vitals:  Vitals:   11/28/21 0220 11/28/21 0551  BP: (!) 106/59 114/78  Pulse: 74 64  Resp:    Temp: 36.5 C (!) 36.4 C  SpO2: 99% 99%    Last Pain:  Vitals:   11/28/21 0551  TempSrc: Oral  PainSc:    Pain Goal:                   Laine Giovanetti

## 2021-12-03 ENCOUNTER — Encounter: Payer: Self-pay | Admitting: Obstetrics & Gynecology

## 2021-12-07 ENCOUNTER — Telehealth (HOSPITAL_COMMUNITY): Payer: Self-pay | Admitting: *Deleted

## 2021-12-07 NOTE — Telephone Encounter (Signed)
Mom reports feeling good. No concerns about herself at this time. EPDS=0 [facility] score=1) Mom reports baby is doing well. Feeding, peeing, and pooping without difficulty. Safe sleep reviewed. Mom reports no concerns about baby at present.  Duffy Rhody, RN 12-07-2021 at 10:52am

## 2021-12-10 ENCOUNTER — Other Ambulatory Visit: Payer: Self-pay

## 2021-12-10 ENCOUNTER — Encounter: Payer: Self-pay | Admitting: Obstetrics and Gynecology

## 2022-01-02 ENCOUNTER — Ambulatory Visit: Payer: Medicaid Other | Admitting: Obstetrics and Gynecology
